# Patient Record
Sex: Male | Born: 1975 | Race: White | Hispanic: No | Marital: Married
Health system: Southern US, Community
[De-identification: ages and names within clinical notes are randomized; demographics above are authoritative.]

---

## 2014-01-25 ENCOUNTER — Ambulatory Visit (HOSPITAL_COMMUNITY)
Admission: RE | Admit: 2014-01-25 | Discharge: 2014-01-25 | Disposition: A | Discharge status: Home / Self Care | Payer: 59 | Source: Ambulatory Visit | Attending: Sports Medicine | Admitting: Sports Medicine

## 2014-01-25 DIAGNOSIS — M7710 Lateral epicondylitis, unspecified elbow: Secondary | ICD-10-CM | POA: Insufficient documentation

## 2014-01-25 DIAGNOSIS — M25539 Pain in unspecified wrist: Secondary | ICD-10-CM | POA: Insufficient documentation

## 2014-01-25 DIAGNOSIS — M25529 Pain in unspecified elbow: Secondary | ICD-10-CM | POA: Insufficient documentation

## 2014-01-25 DIAGNOSIS — IMO0001 Reserved for inherently not codable concepts without codable children: Secondary | ICD-10-CM | POA: Insufficient documentation

## 2014-01-25 DIAGNOSIS — M6281 Muscle weakness (generalized): Secondary | ICD-10-CM | POA: Insufficient documentation

## 2014-01-25 NOTE — Evaluation (Signed)
Physical Therapy Evaluation  Patient Details  Name: Edward Barnes. MRN: [id number] Date of Birth: [birth year]  Today's Date: 01/25/2014 Time: 0850-0935 PT Time Calculation (min): 45 min              Visit#: 1 of 12  Re-eval: 02/24/14    Authorization: Christella Scheuermann    Authorization Time Period:    Authorization Visit#: 1 of 8   Past Medical History: No past medical history on file. Past Surgical History: No past surgical history on file.  Subjective Symptoms/Limitations Symptoms: Lt achilles sore to the touch, no numbness and tingling, no swelling Pertinent History: Ankle began to hurt gradually no specific cause of injury.  Limitations: Walking How long can you walk comfortably?: 15 minutes Special Tests: FOTO score 57/100 Patient Stated Goals: Decrease pain and get back to walking regularly. To lose weight.  Pain Assessment Currently in Pain?: Yes Pain Score: 8  Pain Location: Ankle Pain Orientation: Left Pain Type: Acute pain Pain Onset: More than a month ago Pain Frequency: Intermittent Effect of Pain on Daily Activities: walkin and exercise, prolonged standing  Precautions/Restrictions  Precautions Precautions: None  Balance Screening Balance Screen Has the patient fallen in the past 6 months: No  Prior Functioning     Cognition/Observation Observation/Other Assessments Observations: FOTO: 54% limited  Sensation/Coordination/Flexibility/Functional Tests Flexibility Thomas: Positive 90/90: Positive Functional Tests Functional Tests: 3D hip excursions: limited hip extension Functional Tests: Gait: no function calcaneal inversion at or prior to heel off, excessive knee valgus, Lt hip internally rotates less than Rt, early heel rise  Functional Tests: Foot mechanics: Patient displays decreased function inversion with little midfoot locking mechanism with forced ankle inversion and excessive pronation of foot, as well as lumited talocrural mobility with limited  talar posterior gliding.   Assessment RLE AROM (degrees) Right Ankle Dorsiflexion: 12 RLE Strength Right Hip External Rotation : 45 Right Hip Internal Rotation : 18 LLE AROM (degrees) Left Hip External Rotation : 45 Left Hip Internal Rotation : 31 Left Knee Flexion: 80 Left Ankle Dorsiflexion: 5 Left Ankle Plantar Flexion: 60 Left Ankle Inversion: 40 Left Ankle Eversion: 10 LLE Strength Left Hip ABduction: 4/5 Left Knee Extension: 4/5 Left Ankle Dorsiflexion: 5/5 Palpation Palpation: moderate fascial restriction in gastroc and achilles   Exercise/Treatments Stretches Gastroc Stretch: Limitations Gastroc Stretch Limitations: 3way at wall 10x 3 sec  Manual Therapy Manual Therapy: Myofascial release Joint Mobilization: posterior talar glides to increase dorsiflexion, calcaneal inversion glides Myofascial Release: gastroc, soleus, achilles  Physical Therapy Assessment and Plan PT Assessment and Plan Clinical Impression Statement: Patient displays posterior achilles pain secondary to limited gastroc/soleus muscle mobility and weakness resulting in difficulty walking. Patient displays other contributing factors including: limited hip mobility, poor foot mechanics, limited great toe extension, and hip weakness. Patient will benefit from skilled PT to adress the above limitations and progress towards meeting goals Rehab Potential: Excellent Clinical Impairments Affecting Rehab Potential: Highly motivated.  PT Frequency: Min 2X/week PT Duration: 6 weeks PT Treatment/Interventions: Gait training;Stair training;Therapeutic exercise;Balance training;Patient/family education;Manual techniques PT Plan: Introduce hip flexion, hamstring, piriformis, and groin stretches next session (all 3 way) and standing 3D ankle Excursion. Execpting patient to required physical therapy for 6 weeks 2x weekly to meet all goals and return to work without pain.     Goals Home Exercise Program Pt/caregiver  will Perform Home Exercise Program: For increased ROM;For increased strengthening PT Goal: Perform Home Exercise Program - Progress: Progressing toward goal PT Short Term Goals Time to Complete Short Term  Goals: 3 weeks PT Short Term Goal 1: Patient will be able to walk for 1 hour to perform regular work required activities with pain < 2/10 PT Short Term Goal 2: Patient will demonstrate 4/5 gastroc strength to indicate improving ankle stability and ability to stand on toes to reach to top shelf of kictchen cabinet PT Short Term Goal 3: Patient's dorsiflexion ROM to improve to 20 degrees to allow for increased shock absorption during gait and increase stride length PT Short Term Goal 4: Increase great toe dorsiflexion to 60decrease to decrease early toe off during gait.  PT Long Term Goals Time to Complete Long Term Goals:  (6 weeks) PT Long Term Goal 1: Patient will be able to walk for 4 hours to perform regular work required activities and perform walking program for weight management program with no pain PT Long Term Goal 2: Patient will demonstrate 5/5 gastroc strength to indicate improvied ankle stability and perform extended time on toes for working arouind his house.  Long Term Goal 3: Patient's hip abduction and extension strength to improve to 5/5 to improve strength for squatting to the floor with feet flat.   Problem List Patient Active Problem List   Diagnosis Date Noted  . Pain in joint, forearm 01/25/2014  . Muscle weakness (generalized) 01/25/2014  . Lateral epicondylitis (tennis elbow) 01/25/2014    PT - End of Session Activity Tolerance: Patient tolerated treatment well General Behavior During Therapy: WFL for tasks assessed/performed PT Plan of Care PT Home Exercise Plan: 3 way calf stretch,   GP    Edward Barnes Edward Barnes 01/25/2014, 3:07 PM  Physician Documentation Your signature is required to indicate approval of the treatment plan as stated above.  Please sign and  either send electronically or make a copy of this report for your files and return this physician signed original.   Please mark one 1.__approve of plan  2. ___approve of plan with the following conditions.   ______________________________                                                          _____________________ Physician Signature                                                                                                             Date

## 2014-01-25 NOTE — Evaluation (Signed)
Occupational Therapy Evaluation  Patient Details  Name: Edward Barnes. MRN: [id number] Date of Birth: 1975-12-31  Today's Date: 01/25/2014 Time: 7062-3762 OT Time Calculation (min): 30 min OT eval [phone] 30'  Visit#: 1 of 8  Re-eval: 02/22/14  Assessment Diagnosis: Right Lateral Epocondylitis Next MD Visit: 02/16/14 Dr. Drema Dallas Prior Therapy: None  Authorization:    Authorization Time Period:    Authorization Visit#:   of     Past Medical History: No past medical history on file. Past Surgical History: No past surgical history on file.  Subjective Symptoms/Limitations Symptoms: S: I have a hard time lifting anything heavy and raising it up overhead. Pertinent History: Mr. Wat[place] began to experience pain on the lateral aspect of his right elbow in Feb. 2014 after he was finished scraping the ice from his car. Mr. Dewan had received a cortizone short in his right elbow last week. Dr. Drema Dallas has referred patient to occupational therapy for eval and treat.  Limitations: opening doors (turning knob, opening tight jars and bottles, lifting heavy items. Special Tests: FOTO score 57/100 Patient Stated Goals: To be pain free and be able to use arm normally.  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 Dominant Hand: Right Vision - History Baseline Vision: No visual deficits  Cognition/Observation Cognition Overall Cognitive Status: Within Functional Limits for tasks assessed Arousal/Alertness: Awake/alert Orientation Level: Oriented X4  Sensation/Coordination/Edema Coordination 9 Hole Peg Test: Rt 19.5" Lt:  18.1"  Edema Edema: 33 cm Rt elbow, 30.5 cm Lt elbow  Additional Assessments RUE AROM (degrees) Right Elbow Flexion: 50 Right Elbow Extension: 0 Right Forearm Pronation: 90 Degrees Right Forearm Supination: 60 Degrees Right Wrist Extension: 70 Degrees Right Wrist Flexion: 60 Degrees Right Wrist Radial Deviation: 20 Degrees Right Wrist Ulnar Deviation: 32 Degrees RUE Strength Right Elbow Flexion:  (4-/5) Right Elbow Extension:  (4-/5) Right Forearm Pronation:  (4-/5) Right Forearm Supination:  (4-/5) Right Wrist Flexion:  (4+/5) Right Wrist Extension:  (4+/5) Right Wrist Radial Deviation:  (4+/5) Grip (lbs): 60 Lateral Pinch: 26 lbs 3 Point Pinch: 18 lbs LUE Strength Grip (lbs): 115 Lateral Pinch: 24 lbs 3 Point Pinch: 24 lbs Palpation Palpation: Mod fascial restrictions on lateral region of right elbow.  Right Hand Strength - Pinch (lbs) Lateral Pinch: 26 lbs 3 Point Pinch: 18 lbs Left Hand Strength - Pinch (lbs) Lateral Pinch: 24 lbs 3 Point Pinch: 24 lbs    Occupational Therapy Assessment and Plan OT Assessment and Plan Clinical Impression Statement: A: Patient is a 38 y/o male s/p right lateral epicondylitis resulting in increased pain, fascial restrictions and limited joint mobility causing difficulty completing BADL and leisure tasks. Pt will benefit from skilled therapeutic intervention in order to improve on the following deficits: Increased fascial restricitons;Impaired UE functional use;Pain;Decreased strength;Decreased range of motion;Increased edema Rehab  Potential: Excellent OT Frequency: Min  2X/week OT Duration: 4 weeks OT Treatment/Interventions: Self-care/ADL training;Modalities;Splinting;Therapeutic exercise;Therapeutic activities;DME and/or AE instruction;Manual therapy;Patient/family education OT Plan: P: Pt will benefit from skilled OT services to increase ROM, increase strength, decrease fascial restrictions, decrease pain, and improve RUE functional use.       Treatment Plan: Myofascial release (MFR) and manual stretching, AROM, strengthening,modalities as needed for pain,     Goals Short Term Goals Time to Complete Short Term Goals: 2 weeks Short Term Goal 1: Patient will be educated on HEP. Short Term Goal 2: Patient report a pain score of 6/10 with daily tasks. Short Term Goal 3: Patient will increase AROM to WNL to increase ability to complete daily tasks with less difficulty.  Short Term Goal 4: Patient will increase Right elbow and wrist strength to 4+/5 to increase ability to lift heavy items overhead. Short Term Goal 5: Patient will increase grip strength by 10# and pinch strength by 2# to increase ability to lift and hold onto a heavy item such as a coffee. Additional Short Term Goals?: Yes Short Term Goal 6: Patient will decrease fascial restrictions from Mod to Min-Mod. Short Term Goal 7: Patient will decrease edema in right elbow region by being educated and participating in edema management techniques.  Long Term Goals Time to Complete Long Term Goals: 4 weeks Long Term Goal 1: Patient will return to highest level of independence with all BADL, work, and leisure tasks.  Long Term Goal 2: Patient will report a pain score of 4/10 or less with daily tasks.  Long Term Goal 3: Patient will increase Right elbow and wrist strength to 5/5 to increase ability to lift heavy items overhead.  Long Term Goal 4: Patient will increase grip strength by 20# and pinch strength by 4# to increase ability to lift and hold onto a heavy  items. Long Term Goal 5: Patient will decrease fascial restrictions from min-mod to trace.  Problem List Patient Active Problem List   Diagnosis Date Noted  . Pain in joint, forearm 01/25/2014  . Muscle weakness (generalized) 01/25/2014  . Lateral epicondylitis (tennis elbow) 01/25/2014    End of Session Activity Tolerance: Patient tolerated treatment well General Behavior During Therapy: [facility] for tasks assessed/performed OT Plan of Care OT Home Exercise Plan: elbow exercises  OT Patient Instructions: handout (scanned) Consulted and Agree with Plan of Care: Patient   Ailene Ravel, OTR/L,CBIS   01/25/2014, 10:13 AM  Physician Documentation Your signature is required to indicate approval of the treatment plan as stated above.  Please sign and either send electronically or make a copy of this report for your files and return this physician signed original.  Please mark one 1.__approve of plan  2. ___approve of plan with the following conditions.   ______________________________                                                          _____________________ Physician Signature  Date  

## 2014-01-27 ENCOUNTER — Ambulatory Visit (HOSPITAL_COMMUNITY)
Admission: RE | Admit: 2014-01-27 | Discharge: 2014-01-27 | Disposition: A | Discharge status: Home / Self Care | Payer: 59 | Source: Ambulatory Visit | Attending: Internal Medicine | Admitting: Internal Medicine

## 2014-01-27 ENCOUNTER — Ambulatory Visit (HOSPITAL_COMMUNITY)
Admission: RE | Admit: 2014-01-27 | Discharge: 2014-01-27 | Disposition: A | Discharge status: Home / Self Care | Payer: 59 | Source: Ambulatory Visit | Attending: Physical Therapy | Admitting: Physical Therapy

## 2014-01-27 DIAGNOSIS — M7710 Lateral epicondylitis, unspecified elbow: Secondary | ICD-10-CM | POA: Insufficient documentation

## 2014-01-27 DIAGNOSIS — M6281 Muscle weakness (generalized): Secondary | ICD-10-CM | POA: Insufficient documentation

## 2014-01-27 DIAGNOSIS — IMO0001 Reserved for inherently not codable concepts without codable children: Secondary | ICD-10-CM | POA: Insufficient documentation

## 2014-01-27 DIAGNOSIS — M25529 Pain in unspecified elbow: Secondary | ICD-10-CM | POA: Insufficient documentation

## 2014-01-27 NOTE — Progress Notes (Signed)
Physical Therapy Treatment Patient Details  Name: Edward Barnes. MRN: [id number] Date of Birth: 1976/02/02  Today's Date: 01/27/2014 Time: 1640-1720 PT Time Calculation (min): 40 min  Charges: Manual therapy 1640-1650, TherEx 1650-1720 Visit#: 2 of 12  Re-eval: 02/24/14 Assessment Diagnosis: Difficulty walking, ankle pain, and weakness.  Prior Therapy: no  Authorization: [employer]  Authorization Time Period:    Authorization Visit#: 2 of 8   Subjective: Symptoms/Limitations Symptoms: Lt achilles sore to the touch, notes increased pain this sessionattributed to massage last session.  Pertinent History: Ankle began to hurt gradually no specific cause of injury.  Limitations: Walking Pain Assessment Currently in Pain?: Yes Pain Score: 5  Pain Location: Ankle Pain Orientation: Left Pain Type: Acute pain Pain Onset: More than a month ago  Exercise/Treatments Stretches Active Hamstring Stretch: Limitations Active Hamstring Stretch Limitations: 3way 10x to 8" box Hip Flexor Stretch: Limitations Hip Flexor Stretch Limitations: 10x 3 way to 8" box Gastroc Stretch: Limitations Gastroc Stretch Limitations: 3way at wall 10x 3 sec Standing Rocker Board: 5 minutes (anterior posterior) Other Standing Knee Exercises: Groin stretch 2 way to 8" 10x 3sec Other Standing Knee Exercises: 3D ankle excursions 10x   Manual Therapy Manual Therapy: Myofascial release Joint Mobilization: posterior talar glides to increase dorsiflexion, calcaneal inversion glides Myofascial Release: MFR to medial elbow and extensor forarm to decrease fascial restrictions and pain.  Physical Therapy Assessment and Plan PT Assessment and Plan Clinical Impression Statement: Patient displays posterior achilles pain secondary to limited gastroc/soleus muscle mobility and weakness resulting in difficulty walking. Patient displays other contributing factors including: limited hip mobility, poor foot mechanics, limited  great toe extension, and hip weakness. Patient will benefit from skilled PT to adress the above limitations and progress towards meeting goals. Patient responded today's treatmens focusin on ankle and hip mobility with improved gait and decreased pain.  Patient was also instructed in use of a foam roller to perform self massage at home.  Pt will benefit from skilled therapeutic intervention in order to improve on the following deficits: Increased fascial restricitons;Impaired UE functional use;Pain;Decreased strength;Decreased range of motion;Increased edema Clinical Impairments Affecting Rehab Potential: Highly motivated.  PT Frequency: Min 2X/week PT Duration: 6 weeks PT Treatment/Interventions: Gait training;Stair training;Therapeutic exercise;Balance training;Patient/family education;Manual techniques PT Plan: Continue hip flexion, hamstring and groin stretches next session (all 3 way). Progress standing 3D ankle Excursion to at wall and introduce piriformis stretch. Introduce SFT squat with no weight and through pain free ROM next session to begin introducing low level strength.     Goals PT Short Term Goals PT Short Term Goal 1: Patient will be able to walk for 1 hour to perform regular work required activities with pain < 2/10 PT Short Term Goal 1 - Progress: Progressing toward goal PT Short Term Goal 2: Patient will demonstrate 4/5 gastroc strength to indicate improving ankle stability and ability to stand on toes to reach to top shelf of kictchen cabinet PT Short Term Goal 2 - Progress: Progressing toward goal PT Short Term Goal 3: Patient's dorsiflexion ROM to improve to 20 degrees to allow for increased shock absorption during gait and increase stride length PT Short Term Goal 3 - Progress: Progressing toward goal PT Short Term Goal 4: Increase great toe dorsiflexion to 60decrease to decrease early toe off during gait.  PT Short Term Goal 4 - Progress: Progressing toward goal PT Long  Term Goals PT Long Term Goal 1: Patient will be able to walk for 4 hours to perform  regular work required activities and perform walking program for [employer] management program with no pain PT Long Term Goal 1 - Progress: Progressing toward goal PT Long Term Goal 2: Patient will demonstrate 5/5 gastroc strength to indicate improvied ankle stability and perform extended time on toes for working arouind his house.  PT Long Term Goal 2 - Progress: Progressing toward goal Long Term Goal 3: Patient's hip abduction and extension strength to improve to 5/5 to improve strength for squatting to the floor with feet flat.  Long Term Goal 3 Progress: Progressing toward goal  Problem List Patient Active Problem List   Diagnosis Date Noted  . Pain in joint, forearm 01/25/2014  . Muscle weakness (generalized) 01/25/2014  . Lateral epicondylitis (tennis elbow) 01/25/2014    PT - End of Session Activity Tolerance: Patient tolerated treatment well General Behavior During Therapy: WFL for tasks assessed/performed PT Plan of Care PT Home Exercise Plan: 3 way calf stretch,  hip flexion, hamstring and groin stretches, 3D ankle excursions twice daily.     Camaryn Lumbert R Kamill Fulbright PT DPT 01/27/2014, 5:24 PM

## 2014-01-27 NOTE — Progress Notes (Signed)
Occupational Therapy Treatment Patient Details  Name: Edward Barnes. MRN: [id number] Date of Birth: 1976/02/16  Today's Date: 01/27/2014 Time: 6440-3474 OT Time Calculation (min): 47 min Manual 1427-1455 (28') Therapeutic Exercises 1455-1514 (19')  Visit#: 2 of 8  Re-eval: 02/22/14    Authorization:    Authorization Time Period:    Authorization Visit#:   of    Subjective Symptoms/Limitations Symptoms: "Its not been a bad day." Pain Assessment Currently in Pain?: Yes Pain Score: 2   Exercise/Treatments Elbow Exercises Elbow Flexion: PROM;AROM;10 reps Elbow Extension: PROM;AROM;10 reps Forearm Supination: PROM;AROM;10 reps Forearm Pronation: PROM;AROM;10 reps   Theraputty - Roll: red putty - focus on extending and flexing elbow Theraputty - Grip: red putty - in supinationa and pronation  Wrist Exercises Forearm Supination: PROM;AROM;10 reps Forearm Pronation: PROM;AROM;10 reps   Theraputty - Roll: red putty - focus on extending and flexing elbow Theraputty - Grip: red putty - in supinationa and pronation Theraputty - Pinch: red putty - 3-point and key pinches for supination and pronation  Wrist Weighted Stretch Wrist Flexion - Weighted Stretch: 1 pound;60 seconds Wrist Extension - Weighted Stretch: 1 pound;60 seconds  Fine Motor Coordination Wrist Flexion - Weighted Stretch: 1 pound;60 seconds Wrist Extension - Weighted Stretch: 1 pound;60 seconds     Manual Therapy Manual Therapy: Myofascial release Myofascial Release: MFR to medial elbow and extensor forarm to decrease fascial restrictions and pain.  Occupational Therapy Assessment and Plan OT Assessment and Plan Clinical Impression Statement: Initiated myofascial release this session - pt had good response with decreased restrictions in elbow region.  pt verbalized feeling good stretches with PROM (particulary elbow flexion in supination), with 1# weight, and pink theraputty. OT Plan: Continue MFR to  fascial restrictions. Educate pt on edema management.   Goals Short Term Goals Short Term Goal 1: Patient will be educated on HEP. Short Term Goal 1 Progress: Progressing toward goal Short Term Goal 2: Patient report a pain score of 6/10 with daily tasks. Short Term Goal 2 Progress: Progressing toward goal Short Term Goal 3: Patient will increase AROM to WNL to increase ability to complete daily tasks with less difficulty.  Short Term Goal 3 Progress: Progressing toward goal Short Term Goal 4: Patient will increase Right elbow and wrist strength to 4+/5 to increase ability to lift heavy items overhead. Short Term Goal 4 Progress: Progressing toward goal Short Term Goal 5: Patient will increase grip strength by 10# and pinch strength by 2# to increase ability to lift and hold onto a heavy item such as a coffee. Short Term Goal 5 Progress: Progressing toward goal Additional Short Term Goals?: Yes Short Term Goal 6: Patient will decrease fascial restrictions from Mod to Min-Mod. Short Term Goal 6 Progress: Progressing toward goal Short Term Goal 7: Patient will decrease edema in right elbow region by being educated and participating in edema management techniques.  Long Term Goals Long Term Goal 1: Patient will return to highest level of independence with all BADL, work, and leisure tasks.  Long Term Goal 1 Progress: Progressing toward goal Long Term Goal 2: Patient will report a pain score of 4/10 or less with daily tasks.  Long Term Goal 2 Progress: Progressing toward goal Long Term Goal 3: Patient will increase Right elbow and wrist strength to 5/5 to increase ability to lift heavy items overhead.  Long Term Goal 3 Progress: Progressing toward goal Long Term Goal 4: Patient will increase grip strength by 20# and pinch strength by 4# to  increase ability to lift and hold onto a heavy items. Long Term Goal 4 Progress: Progressing toward goal Long Term Goal 5: Patient will decrease fascial  restrictions from min-mod to trace. Long Term Goal 5 Progress: Progressing toward goal  Problem List Patient Active Problem List   Diagnosis Date Noted  . Pain in joint, forearm 01/25/2014  . Muscle weakness (generalized) 01/25/2014  . Lateral epicondylitis (tennis elbow) 01/25/2014    End of Session Activity Tolerance: Patient tolerated treatment well General Behavior During Therapy: [facility] for tasks assessed/performed  GO   Bea Graff, [place], OTR/L [phone]  01/27/2014, 4:23 PM

## 2014-01-31 ENCOUNTER — Ambulatory Visit (HOSPITAL_COMMUNITY)
Admission: RE | Admit: 2014-01-31 | Discharge: 2014-01-31 | Disposition: A | Discharge status: Home / Self Care | Payer: 59 | Source: Ambulatory Visit | Attending: Internal Medicine | Admitting: Internal Medicine

## 2014-01-31 NOTE — Progress Notes (Addendum)
Physical Therapy Treatment Patient Details  Name: Edward Barnes. MRN: [id number] Date of Birth: 1976/09/12  Today's Date: 01/31/2014 Time: 9678-9381 PT Time Calculation (min): 42 min Charge: TE 0175-1025, Manual 8527-7824  Visit#: 3 of 12  Re-eval: 02/24/14    Authorization: Christella Scheuermann  Authorization Time Period:    Authorization Visit#: 3 of 8   Subjective: Symptoms/Limitations Symptoms: Pain free today, compliant with HEP without questions Pain Assessment Currently in Pain?: No/denies  Precautions/Restrictions  Precautions Precautions: None  Exercise/Treatments Stretches Active Hamstring Stretch: Limitations Active Hamstring Stretch Limitations: 3way 10x to 8" box Hip Flexor Stretch: Limitations Hip Flexor Stretch Limitations: 10x 3 way to 8" box Piriformis Stretch: 3 reps;30 seconds;Limitations Piriformis Stretch Limitations: supine 4way cross supine with towel Gastroc Stretch: Limitations;3 reps;30 seconds Gastroc Stretch Limitations: slant board 3 way Soleus Stretch: 2 reps;30 seconds;Limitations Soleus Stretch Limitations: slant board Standing Heel Raises: 10 reps Rocker Board: 2 minutes;Limitations Rocker Board Limitations: R/L and A/P intermittent HHA Other Standing Knee Exercises: Groin stretch 2 way to 8" 10x 3sec Other Standing Knee Exercises: 3D ankle excursions 10x  SFT squat matrix 10x    Manual Therapy Joint mobs: posterior talar glides to increase dorsiflexion, calcaneal inversion glides   Physical Therapy Assessment and Plan PT Assessment and Plan Clinical Impression Statement: Began SFT squat matrix for LE strengthening and to increase dorsiflexion, pt required multimodal cueing for proper positioning to improve form to reduce stress Bil knees.  Continues hip and ankle excursion exercises to improve mobility with gait.  Added piriformis stretches for improved flexibilty.  Ended session with manual to improve joint mobility for increased  dorsiflexion.. PT Plan: Continue LE stretches to improve hip flexion, hamstrings, groin and piriformis stretches to improve flexibilty (3 way).  Continue hip and ankle excursion, SFT squat matrix wtih no pain and through pain free ROM.    Goals PT Short Term Goals PT Short Term Goal 1: Patient will be able to walk for 1 hour to perform regular work required activities with pain < 2/10 PT Short Term Goal 2: Patient will demonstrate 4/5 gastroc strength to indicate improving ankle stability and ability to stand on toes to reach to top shelf of kictchen cabinet PT Short Term Goal 2 - Progress: Progressing toward goal PT Short Term Goal 3: Patient's dorsiflexion ROM to improve to 20 degrees to allow for increased shock absorption during gait and increase stride length PT Short Term Goal 3 - Progress: Progressing toward goal PT Short Term Goal 4: Increase great toe dorsiflexion to 60decrease to decrease early toe off during gait.  PT Long Term Goals PT Long Term Goal 1: Patient will be able to walk for 4 hours to perform regular work required activities and perform walking program for weight management program with no pain PT Long Term Goal 2: Patient will demonstrate 5/5 gastroc strength to indicate improvied ankle stability and perform extended time on toes for working arouind his house.  Long Term Goal 3: Patient's hip abduction and extension strength to improve to 5/5 to improve strength for squatting to the floor with feet flat.   Problem List Patient Active Problem List   Diagnosis Date Noted  . Pain in joint, forearm 01/25/2014  . Muscle weakness (generalized) 01/25/2014  . Lateral epicondylitis (tennis elbow) 01/25/2014    PT - End of Session Activity Tolerance: Patient tolerated treatment well General Behavior During Therapy: [facility] for tasks assessed/performed  GP    Aldona Lento 01/31/2014, 5:06 PM

## 2014-01-31 NOTE — Progress Notes (Signed)
Occupational Therapy Treatment Patient Details  Name: Edward Barnes. MRN: [id number] Date of Birth: [birth year]  Today's Date: 01/31/2014 Time: 8546-2703 OT Time Calculation (min): 33 min MFR 5009-3818 10' Therex 2993-7169 23'  Visit#: 3 of 8  Re-eval: 02/22/14    Authorization:    Authorization Time Period:    Authorization Visit#:   of    Subjective Symptoms/Limitations Symptoms: S: It just depends on what I'm doing and that'll make it hurt.  Pain Assessment Currently in Pain?: No/denies  Precautions/Restrictions  Precautions Precautions: None  Exercise/Treatments Elbow Exercises Elbow Flexion: PROM;10 reps;Strengthening;15 reps (1#) Elbow Extension: PROM;10 reps;Strengthening (1#) Forearm Supination: PROM;10 reps;Strengthening;Theraband;15 reps (1#) Theraband Level (Supination): Level 2 (Red) Forearm Pronation: PROM;10 reps;Strengthening;Theraband;15 reps (1#) Theraband Level (Pronation): Level 2 (Red) Wrist Flexion: Theraband;15 reps Theraband Level (Wrist Flexion): Level 2 (Red) Wrist Extension: Theraband;15 reps Theraband Level (Wrist Extension): Level 2 (Red)   Wrist Exercises Forearm Supination: PROM;10 reps;Strengthening;Theraband;15 reps (1#) Theraband Level (Supination): Level 2 (Red) Forearm Pronation: PROM;10 reps;Strengthening;Theraband;15 reps (1#) Theraband Level (Pronation): Level 2 (Red) Wrist Flexion: Theraband;15 reps Theraband Level (Wrist Flexion): Level 2 (Red) Wrist Extension: Theraband;15 reps Theraband Level (Wrist Extension): Level 2 (Red)   Theraputty - Flatten: red- standing (after used pvc pipe to press holes Theraputty - Roll: red putty - focus on extending and flexing elbow Theraputty - Grip: red putty - in supinationa and pronation Theraputty - Pinch: red putty - 3-point and key pinches for supination and pronation  Wrist Weighted Stretch Supination - Weighted Stretch: 1 pound;60 seconds Wrist Flexion - Weighted Stretch: 1  pound;60 seconds Wrist Extension - Weighted Stretch: 1 pound;60 seconds      Manual Therapy Manual Therapy: Myofascial release Myofascial Release: MFR to medial elbow and extensor forarm to decrease fascial restrictions and pain.  Occupational Therapy Assessment and Plan OT Assessment and Plan Clinical Impression Statement: A: Discussed edema management. Edema appears to have gone down some since evaluation. Added strengthening exercises with1# weight and red theraband this date. Patient reports no pain or discomfort with exercises.  OT Plan: P: Added weighted wrist flexion/extension bar.   Goals Short Term Goals Time to Complete Short Term Goals: 2 weeks Short Term Goal 1: Patient will be educated on HEP. Short Term Goal 1 Progress: Progressing toward goal Short Term Goal 2: Patient report a pain score of 6/10 with daily tasks. Short Term Goal 2 Progress: Progressing toward goal Short Term Goal 3: Patient will increase AROM to WNL to increase ability to complete daily tasks with less difficulty.  Short Term Goal 3 Progress: Progressing toward goal Short Term Goal 4: Patient will increase Right elbow and wrist strength to 4+/5 to increase ability to lift heavy items overhead. Short Term Goal 4 Progress: Progressing toward goal Short Term Goal 5: Patient will increase grip strength by 10# and pinch strength by 2# to increase ability to lift and hold onto a heavy item such as a coffee. Short Term Goal 5 Progress: Progressing toward goal Additional Short Term Goals?: Yes Short Term Goal 6: Patient will decrease fascial restrictions from Mod to Min-Mod. Short Term Goal 6 Progress: Progressing toward goal Short Term Goal 7: Patient will decrease edema in right elbow region by being educated and participating in edema management techniques.  Short Term Goal 7 Progress: Progressing toward goal Long Term Goals Time to Complete Long Term Goals: 4 weeks Long Term Goal 1: Patient will return to  highest level of independence with all BADL, work, and leisure tasks.  Long Term Goal 1 Progress: Progressing toward goal Long Term Goal 2: Patient will report a pain score of 4/10 or less with daily tasks.  Long Term Goal 2 Progress: Progressing toward goal Long Term Goal 3: Patient will increase Right elbow and wrist strength to 5/5 to increase ability to lift heavy items overhead.  Long Term Goal 3 Progress: Progressing toward goal Long Term Goal 4: Patient will increase grip strength by 20# and pinch strength by 4# to increase ability to lift and hold onto a heavy items. Long Term Goal 4 Progress: Progressing toward goal Long Term Goal 5: Patient will decrease fascial restrictions from min-mod to trace. Long Term Goal 5 Progress: Progressing toward goal  Problem List Patient Active Problem List   Diagnosis Date Noted  . Pain in joint, forearm 01/25/2014  . Muscle weakness (generalized) 01/25/2014  . Lateral epicondylitis (tennis elbow) 01/25/2014    End of Session Activity Tolerance: Patient tolerated treatment well General Behavior During Therapy: [facility] for tasks assessed/performed   Ailene Ravel, OTR/L,CBIS   01/31/2014, 2:25 PM

## 2014-02-02 ENCOUNTER — Ambulatory Visit (HOSPITAL_COMMUNITY)
Admission: RE | Admit: 2014-02-02 | Discharge: 2014-02-02 | Disposition: A | Discharge status: Home / Self Care | Payer: 59 | Source: Ambulatory Visit | Attending: Internal Medicine | Admitting: Internal Medicine

## 2014-02-02 NOTE — Progress Notes (Signed)
Occupational Therapy Treatment Patient Details  Name: Boaz Berisha. MRN: [id number] Date of Birth: 10-13-1975  Today's Date: 02/02/2014 Time: 1829-9371 OT Time Calculation (min): 38 min MFR 696-789 10' Therex [id number] 28'   Visit#: 4 of 8  Re-eval: 02/22/14    Authorization:    Authorization Time Period:    Authorization Visit#:   of    Subjective Pain Assessment Currently in Pain?: No/denies  Precautions/Restrictions  Precautions Precautions: None  Exercise/Treatments Elbow Exercises Elbow Flexion: PROM;10 reps;Strengthening;20 reps (1#) Elbow Extension: PROM;10 reps;Strengthening;20 reps (1#) Forearm Supination: PROM;10 reps;Strengthening;Theraband;20 reps (1#) Theraband Level (Supination): Level 2 (Red) Forearm Pronation: PROM;10 reps;Strengthening;Theraband;20 reps (1#) Theraband Level (Pronation): Level 2 (Red) Wrist Flexion: PROM;10 reps;Strengthening;Theraband;20 reps (1#) Theraband Level (Wrist Flexion): Level 2 (Red) Wrist Extension: PROM;10 reps;Strengthening;Theraband;20 reps (1#) Theraband Level (Wrist Extension): Level 2 (Red) Other elbow exercises: Weighted wrist flexion/extension; 5X up/down with elbows bent 90 degrees. 3#    Wrist Exercises Forearm Supination: PROM;10 reps;Strengthening;Theraband;20 reps (1#) Theraband Level (Supination): Level 2 (Red) Forearm Pronation: PROM;10 reps;Strengthening;Theraband;20 reps (1#) Theraband Level (Pronation): Level 2 (Red) Wrist Flexion: PROM;10 reps;Strengthening;Theraband;20 reps (1#) Theraband Level (Wrist Flexion): Level 2 (Red) Wrist Extension: PROM;10 reps;Strengthening;Theraband;20 reps (1#) Theraband Level (Wrist Extension): Level 2 (Red)   Theraputty - Flatten: red- standing (afterwards used pvc pipe to press holes into putty) Theraputty - Roll: red putty - focus on extending and flexing elbow Theraputty - Grip: red putty - in supinationa and pronation Theraputty - Pinch: red putty - 3-point and  key pinches for supination and pronation Hand Gripper with Medium Beads: 10 beads with black handgripper  Wrist Weighted Stretch Supination - Weighted Stretch: 1 pound;60 seconds Wrist Flexion - Weighted Stretch: 1 pound;60 seconds Wrist Extension - Weighted Stretch: 1 pound;60 seconds      Manual Therapy Manual Therapy: Myofascial release Myofascial Release: MFR to medial elbow and extensor forarm to decrease fascial restrictions and pain.  Occupational Therapy Assessment and Plan OT Assessment and Plan Clinical Impression Statement: A: Added weighted wrist flexion/extension bar. patient tolerated well.  OT Plan: P: Use 2# handweight. Green theraputty   Goals Short Term Goals Time to Complete Short Term Goals: 2 weeks Short Term Goal 1: Patient will be educated on HEP. Short Term Goal 2: Patient report a pain score of 6/10 with daily tasks. Short Term Goal 3: Patient will increase AROM to WNL to increase ability to complete daily tasks with less difficulty.  Short Term Goal 4: Patient will increase Right elbow and wrist strength to 4+/5 to increase ability to lift heavy items overhead. Short Term Goal 5: Patient will increase grip strength by 10# and pinch strength by 2# to increase ability to lift and hold onto a heavy item such as a coffee. Additional Short Term Goals?: Yes Short Term Goal 6: Patient will decrease fascial restrictions from Mod to Min-Mod. Short Term Goal 7: Patient will decrease edema in right elbow region by being educated and participating in edema management techniques.  Long Term Goals Time to Complete Long Term Goals: 4 weeks Long Term Goal 1: Patient will return to highest level of independence with all BADL, work, and leisure tasks.  Long Term Goal 2: Patient will report a pain score of 4/10 or less with daily tasks.  Long Term Goal 3: Patient will increase Right elbow and wrist strength to 5/5 to increase ability to lift heavy items overhead.  Long Term  Goal 4: Patient will increase grip strength by 20# and pinch strength by  4# to increase ability to lift and hold onto a heavy items. Long Term Goal 5: Patient will decrease fascial restrictions from min-mod to trace.  Problem List Patient Active Problem List   Diagnosis Date Noted  . Pain in joint, forearm 01/25/2014  . Muscle weakness (generalized) 01/25/2014  . Lateral epicondylitis (tennis elbow) 01/25/2014    End of Session Activity Tolerance: Patient tolerated treatment well General Behavior During Therapy: [facility] for tasks assessed/performed   Ailene Ravel, OTR/L,CBIS   02/02/2014, 9:27 AM

## 2014-02-02 NOTE — Progress Notes (Signed)
Physical Therapy Treatment Patient Details  Name: Edward Barnes. MRN: [id number] Date of Birth: 1976-02-19  Today's Date: 02/02/2014 Time: 7829-5621 PT Time Calculation (min): 43 min Charge: TE 3086-5784, Manual 6962-9528  Visit#: 4 of 8  Re-eval: 02/24/14 Assessment Diagnosis: Difficulty walking, ankle pain, and weakness.  Next MD Visit: Gilles Chiquito 02/16/2014 Prior Therapy: no  Authorization: [employer]  Authorization Time Period:    Authorization Visit#: 4 of 8   Subjective: Symptoms/Limitations Symptoms: Pt stated he feels better everyday in ankle and overall hip mobility, pain free today. Pain Assessment Currently in Pain?: No/denies  Precautions/Restrictions  Precautions Precautions: None  Exercise/Treatments Stretches Active Hamstring Stretch: Limitations Active Hamstring Stretch Limitations: 3way 10x to 8" box Hip Flexor Stretch: Limitations Hip Flexor Stretch Limitations: 10x 3 way to 8" box Gastroc Stretch: Limitations;3 reps;30 seconds Gastroc Stretch Limitations: slant board 3 way Soleus Stretch: 2 reps;30 seconds;Limitations Soleus Stretch Limitations: slant board Standing Heel Raises: 10 reps;Limitations Heel Raises Limitations: toe raises Forward Lunges: Both;10 reps;Limitations Forward Lunges Limitations: on 8in box Side Lunges: Both;10 reps Lateral Step Up: 15 reps;Hand Hold: 0;Step Height: 4";Left Forward Step Up: Left;15 reps;Hand Hold: 0;Step Height: 4" Rocker Board: 2 minutes;Limitations Rocker Board Limitations: R/L and A/P intermittent HHA Other Standing Knee Exercises: 3D ankle and hip excursions 15x  SFT squat matrix 10x      Manual Therapy Manual Therapy: Myofascial release Joint Mobilization: posterior talar glides to increase dorsiflexion, calcaneal inversion glides, MFR to posterior heel region to reduce fascial restrictions   Physical Therapy Assessment and Plan PT Assessment and Plan Clinical Impression Statement: Pt progressed  well towards POC for LE strengthening and improving ROM.  Began stair training and lunges for functional strengthening and to improve dorsiflexion.  Pt required verbal cueing and demonstration for proper form with FST squats to improve form.  Manual techniques complete to improve joint mobilty and reduce fascial restrictinos  PT Plan: Continue LE stretches to improve hip flexion, hamstrings, groin and piriformis stretches to improve flexibilty (3 way).  Continue hip and ankle excursion, SFT squat matrix wtih no pain and through pain free ROM.    Goals PT Short Term Goals PT Short Term Goal 1: Patient will be able to walk for 1 hour to perform regular work required activities with pain < 2/10 PT Short Term Goal 1 - Progress: Progressing toward goal PT Short Term Goal 2: Patient will demonstrate 4/5 gastroc strength to indicate improving ankle stability and ability to stand on toes to reach to top shelf of kictchen cabinet PT Short Term Goal 2 - Progress: Progressing toward goal PT Short Term Goal 3: Patient's dorsiflexion ROM to improve to 20 degrees to allow for increased shock absorption during gait and increase stride length PT Short Term Goal 3 - Progress: Progressing toward goal PT Short Term Goal 4: Increase great toe dorsiflexion to 60decrease to decrease early toe off during gait.  PT Short Term Goal 4 - Progress: Progressing toward goal PT Long Term Goals PT Long Term Goal 1: Patient will be able to walk for 4 hours to perform regular work required activities and perform walking program for weight management program with no pain PT Long Term Goal 2: Patient will demonstrate 5/5 gastroc strength to indicate improvied ankle stability and perform extended time on toes for working arouind his house.  Long Term Goal 3: Patient's hip abduction and extension strength to improve to 5/5 to improve strength for squatting to the floor with feet flat.   Problem  List Patient Active Problem List    Diagnosis Date Noted  . Pain in joint, forearm 01/25/2014  . Muscle weakness (generalized) 01/25/2014  . Lateral epicondylitis (tennis elbow) 01/25/2014    PT - End of Session Activity Tolerance: Patient tolerated treatment well General Behavior During Therapy: [facility] for tasks assessed/performed  GP    Aldona Lento 02/02/2014, 10:08 AM

## 2014-02-07 ENCOUNTER — Ambulatory Visit (HOSPITAL_COMMUNITY)
Admission: RE | Admit: 2014-02-07 | Discharge: 2014-02-07 | Disposition: A | Discharge status: Home / Self Care | Payer: 59 | Source: Ambulatory Visit | Attending: Internal Medicine | Admitting: Internal Medicine

## 2014-02-07 NOTE — Progress Notes (Signed)
Physical Therapy Treatment Patient Details  Name: Edward Barnes. MRN: [id number] Date of Birth: 1976/01/31  Today's Date: 02/07/2014 Time: 5852-7782 PT Time Calculation (min): 24 min Charge TE [id number]  Visit#: 5 of 8  Re-eval: 02/24/14 Assessment Diagnosis: Difficulty walking, ankle pain, and weakness.  Next MD Visit: Gilles Chiquito 02/16/2014 Prior Therapy: no  Authorization: [employer]  Authorization Time Period:    Authorization Visit#: 5 of 8   Subjective: Symptoms/Limitations Symptoms: Minimal pain today, reports ankle improving daily pain scale 2/10 Pain Assessment Currently in Pain?: Yes Pain Score: 2  Pain Location: Ankle Pain Orientation: Left  Precautions/Restrictions  Precautions Precautions: None  Exercise/Treatments Stretches Active Hamstring Stretch: Limitations Active Hamstring Stretch Limitations: 3way 10x to 8" box Hip Flexor Stretch: Limitations Hip Flexor Stretch Limitations: 10x 3 way to 8" box Gastroc Stretch: Limitations;3 reps;30 seconds Gastroc Stretch Limitations: slant board 3 way Soleus Stretch: 2 reps;30 seconds;Limitations Soleus Stretch Limitations: slant board Standing Heel Raises: 10 reps;Limitations Heel Raises Limitations: toe raises Forward Lunges: Both;10 reps;Limitations Forward Lunges Limitations: on 8in box Side Lunges: Both;10 reps Lateral Step Up: 15 reps;Hand Hold: 0;Step Height: 4";Left Forward Step Up: Left;15 reps;Hand Hold: 0;Step Height: 4" Rocker Board: 2 minutes;Limitations Rocker Board Limitations: R/L and A/P intermittent HHA Other Standing Knee Exercises: 3D ankle and hip excursions 15x  SFT squat matrix 10x   Physical Therapy Assessment and Plan PT Assessment and Plan Clinical Impression Statement: Pt progressing well towards POC for LE strengthening, AROM and pain control.  Pt able to complete all exercises with min cueing for form with squats to reduce stress on knees and increase dorsiflexion.  From  [phone] pt completed therex with physical therapist tech wtih supervision by PTA.  Pt reports pain reduced at end of session.   PT Plan: Continue LE stretches to improve hip flexion, hamstrings, groin and piriformis stretches to improve flexibilty (3 way).  Continue hip and ankle excursion, SFT squat matrix wtih no pain and through pain free ROM.  Progress glut strength as tolerated.    Goals PT Short Term Goals PT Short Term Goal 1: Patient will be able to walk for 1 hour to perform regular work required activities with pain < 2/10 PT Short Term Goal 1 - Progress: Progressing toward goal PT Short Term Goal 2: Patient will demonstrate 4/5 gastroc strength to indicate improving ankle stability and ability to stand on toes to reach to top shelf of kictchen cabinet PT Short Term Goal 2 - Progress: Progressing toward goal PT Short Term Goal 3: Patient's dorsiflexion ROM to improve to 20 degrees to allow for increased shock absorption during gait and increase stride length PT Short Term Goal 3 - Progress: Progressing toward goal PT Short Term Goal 4: Increase great toe dorsiflexion to 60decrease to decrease early toe off during gait.  PT Long Term Goals PT Long Term Goal 1: Patient will be able to walk for 4 hours to perform regular work required activities and perform walking program for weight management program with no pain PT Long Term Goal 2: Patient will demonstrate 5/5 gastroc strength to indicate improvied ankle stability and perform extended time on toes for working arouind his house.  Long Term Goal 3: Patient's hip abduction and extension strength to improve to 5/5 to improve strength for squatting to the floor with feet flat.   Problem List Patient Active Problem List   Diagnosis Date Noted  . Pain in joint, forearm 01/25/2014  . Muscle weakness (generalized) 01/25/2014  . Lateral  epicondylitis (tennis elbow) 01/25/2014    PT - End of Session Activity Tolerance: Patient tolerated  treatment well General Behavior During Therapy: [facility] for tasks assessed/performed  GP    Aldona Lento 02/07/2014, 12:14 PM

## 2014-02-07 NOTE — Progress Notes (Signed)
Occupational Therapy Treatment Patient Details  Name: Edward Barnes. MRN: [id number] Date of Birth: December 30, 1975  Today's Date: 02/07/2014 Time: 3500-9381 OT Time Calculation (min): 42 min MFR 803-810 8' Therex 829-937 34'  Visit#: 5 of 8  Re-eval: 02/22/14    Authorization:    Authorization Time Period:    Authorization Visit#:   of    Subjective Symptoms/Limitations Symptoms: S: I have no pain today. It feels good.  Pain Assessment Currently in Pain?: No/denies  Precautions/Restrictions  Precautions Precautions: None  Exercise/Treatments ROM / Strengthening / Isometric Strengthening UBE (Upper Arm Bike): 2.0 5' forward Cybex Press: 3 plate;20 reps Cybex Row: 3 plate;20 reps   Elbow Exercises Elbow Flexion: PROM;10 reps;Strengthening;20 reps Elbow Extension: PROM;10 reps;Strengthening;20 reps Forearm Supination: PROM;10 reps;Strengthening;Theraband;20 reps Theraband Level (Supination): Level 2 (Red) Forearm Pronation: PROM;10 reps;Strengthening;Theraband;20 reps Theraband Level (Pronation): Level 2 (Red) Wrist Flexion: PROM;10 reps;Strengthening;Theraband;20 reps Theraband Level (Wrist Flexion): Level 2 (Red) Wrist Extension: PROM;10 reps;Strengthening;Theraband;20 reps Theraband Level (Wrist Extension): Level 2 (Red) Other elbow exercises: Weighted wrist flexion/extension; 5X up/down with elbows bent 90 degrees. 3#   Theraputty - Flatten: green-standing Theraputty - Roll: green - focus on extending and flexing elbow Theraputty - Grip: green - supination and pronation Hand Gripper with Medium Beads: 10 beads with black handgripper Hand Gripper with Small Beads: 10 beads with handgripper  Wrist Exercises Forearm Supination: PROM;10 reps;Strengthening;Theraband;20 reps Theraband Level (Supination): Level 2 (Red) Forearm Pronation: PROM;10 reps;Strengthening;Theraband;20 reps Theraband Level (Pronation): Level 2 (Red) Wrist Flexion: PROM;10  reps;Strengthening;Theraband;20 reps Theraband Level (Wrist Flexion): Level 2 (Red) Wrist Extension: PROM;10 reps;Strengthening;Theraband;20 reps Theraband Level (Wrist Extension): Level 2 (Red)   Wrist Weighted Stretch Supination - Weighted Stretch: 2 pounds;2 minutes Wrist Flexion - Weighted Stretch: 2 pounds;2 minutes Wrist Extension - Weighted Stretch: 2 pounds;2 minutes      Manual Therapy Manual Therapy: Myofascial release Myofascial Release: MFR to medial elbow and extensor forarm to decrease fascial restrictions and pain. D/C next session.  Occupational Therapy Assessment and Plan OT Assessment and Plan Clinical Impression Statement: A: Increased to green theraputty, added 2# handweight, Cybex press/row, and UBE bike. Pt tolerated well.  OT Plan: P: D/C MFR and manual stretching. Add table push-ups.   Goals Short Term Goals Time to Complete Short Term Goals: 2 weeks Short Term Goal 1: Patient will be educated on HEP. Short Term Goal 1 Progress: Progressing toward goal Short Term Goal 2: Patient report a pain score of 6/10 with daily tasks. Short Term Goal 2 Progress: Progressing toward goal Short Term Goal 3: Patient will increase AROM to WNL to increase ability to complete daily tasks with less difficulty.  Short Term Goal 3 Progress: Progressing toward goal Short Term Goal 4: Patient will increase Right elbow and wrist strength to 4+/5 to increase ability to lift heavy items overhead. Short Term Goal 4 Progress: Progressing toward goal Short Term Goal 5: Patient will increase grip strength by 10# and pinch strength by 2# to increase ability to lift and hold onto a heavy item such as a coffee. Short Term Goal 5 Progress: Progressing toward goal Additional Short Term Goals?: Yes Short Term Goal 6: Patient will decrease fascial restrictions from Mod to Min-Mod. Short Term Goal 6 Progress: Progressing toward goal Short Term Goal 7: Patient will decrease edema in right elbow  region by being educated and participating in edema management techniques.  Short Term Goal 7 Progress: Progressing toward goal Long Term Goals Time to Complete Long Term Goals: 4 weeks  Long Term Goal 1: Patient will return to highest level of independence with all BADL, work, and leisure tasks.  Long Term Goal 2: Patient will report a pain score of 4/10 or less with daily tasks.  Long Term Goal 3: Patient will increase Right elbow and wrist strength to 5/5 to increase ability to lift heavy items overhead.  Long Term Goal 4: Patient will increase grip strength by 20# and pinch strength by 4# to increase ability to lift and hold onto a heavy items. Long Term Goal 5: Patient will decrease fascial restrictions from min-mod to trace.  Problem List Patient Active Problem List   Diagnosis Date Noted  . Pain in joint, forearm 01/25/2014  . Muscle weakness (generalized) 01/25/2014  . Lateral epicondylitis (tennis elbow) 01/25/2014    End of Session Activity Tolerance: Patient tolerated treatment well General Behavior During Therapy: [facility] for tasks assessed/performed   Ailene Ravel, OTR/L,CBIS   02/07/2014, 8:44 AM

## 2014-02-09 ENCOUNTER — Ambulatory Visit (HOSPITAL_COMMUNITY)
Admission: RE | Admit: 2014-02-09 | Discharge: 2014-02-09 | Disposition: A | Discharge status: Home / Self Care | Payer: 59 | Source: Ambulatory Visit | Attending: Internal Medicine | Admitting: Internal Medicine

## 2014-02-09 NOTE — Progress Notes (Signed)
Occupational Therapy Treatment Patient Details  Name: Edward Barnes. MRN: [id number] Date of Birth: 09/08/1976  Today's Date: 02/09/2014 Time: 0160-1093 OT Time Calculation (min): 40 min Massage [phone] 9' Therex 235-573 23' Ice massage [phone] 8'  Visit#: 6 of 8  Re-eval: 02/22/14    Authorization:    Authorization Time Period:    Authorization Visit#:   of    Subjective Symptoms/Limitations Symptoms: S: Today is feels really tender. It's wierd. I don't recall doing anything to it yesterday.  Pain Assessment Currently in Pain?: Yes Pain Score: 7  Pain Location: Ankle Pain Orientation: Left Pain Type: Chronic pain  Precautions/Restrictions  Precautions Precautions: None  Exercise/Treatments Elbow Exercises Elbow Flexion: Strengthening;20 reps Elbow Extension: Strengthening;20 reps Forearm Supination: Strengthening;Theraband;20 reps Theraband Level (Supination): Level 2 (Red) Forearm Pronation: Strengthening;Theraband;20 reps Theraband Level (Pronation): Level 2 (Red) Wrist Flexion: Strengthening;Theraband;20 reps Theraband Level (Wrist Flexion): Level 2 (Red) Wrist Extension: Strengthening;Theraband;20 reps Theraband Level (Wrist Extension): Level 2 (Red) Other elbow exercises: Weighted wrist flexion/extension; 4X up/down;3#   Theraputty:  (Used pvc pipe to press holes into putty) Theraputty - Flatten: green-standing Theraputty - Roll: green - focus on extending and flexing elbow Theraputty - Grip: green - supination and pronation     Modalities Modalities: Cryotherapy Manual Therapy Manual Therapy: Massage Massage: Masssage to medial elbow and extensory forearm to decrease pain.  Cryotherapy Number Minutes Cryotherapy: 8 Minutes Cryotherapy Location: Other (comment) (elbow) Type of Cryotherapy: Ice massage  Occupational Therapy Assessment and Plan OT Assessment and Plan Clinical Impression Statement: A: Patient reported increase pain/tenderness  this session. Did not add table push-ups due to pain. Some pain noted with exercises. Massage completed at beginning of session and  Ice massage added at end of session for pain management.  OT Plan: P: Add table push-ups   Goals Short Term Goals Time to Complete Short Term Goals: 2 weeks Short Term Goal 1: Patient will be educated on HEP. Short Term Goal 1 Progress: Progressing toward goal Short Term Goal 2: Patient report a pain score of 6/10 with daily tasks. Short Term Goal 2 Progress: Progressing toward goal Short Term Goal 3: Patient will increase AROM to WNL to increase ability to complete daily tasks with less difficulty.  Short Term Goal 3 Progress: Progressing toward goal Short Term Goal 4: Patient will increase Right elbow and wrist strength to 4+/5 to increase ability to lift heavy items overhead. Short Term Goal 4 Progress: Progressing toward goal Short Term Goal 5: Patient will increase grip strength by 10# and pinch strength by 2# to increase ability to lift and hold onto a heavy item such as a coffee. Short Term Goal 5 Progress: Progressing toward goal Additional Short Term Goals?: Yes Short Term Goal 6: Patient will decrease fascial restrictions from Mod to Min-Mod. Short Term Goal 6 Progress: Progressing toward goal Short Term Goal 7: Patient will decrease edema in right elbow region by being educated and participating in edema management techniques.  Short Term Goal 7 Progress: Progressing toward goal Long Term Goals Time to Complete Long Term Goals: 4 weeks Long Term Goal 1: Patient will return to highest level of independence with all BADL, work, and leisure tasks.  Long Term Goal 1 Progress: Progressing toward goal Long Term Goal 2: Patient will report a pain score of 4/10 or less with daily tasks.  Long Term Goal 2 Progress: Progressing toward goal Long Term Goal 3: Patient will increase Right elbow and wrist strength to 5/5 to increase ability  to lift heavy items  overhead.  Long Term Goal 3 Progress: Progressing toward goal Long Term Goal 4: Patient will increase grip strength by 20# and pinch strength by 4# to increase ability to lift and hold onto a heavy items. Long Term Goal 4 Progress: Progressing toward goal Long Term Goal 5: Patient will decrease fascial restrictions from min-mod to trace. Long Term Goal 5 Progress: Progressing toward goal  Problem List Patient Active Problem List   Diagnosis Date Noted  . Pain in joint, forearm 01/25/2014  . Muscle weakness (generalized) 01/25/2014  . Lateral epicondylitis (tennis elbow) 01/25/2014    End of Session Activity Tolerance: Patient tolerated treatment well General Behavior During Therapy: [facility] for tasks assessed/performed   Ailene Ravel, OTR/L,CBIS   02/09/2014, 8:50 AM

## 2014-02-09 NOTE — Progress Notes (Signed)
Physical Therapy Treatment Patient Details  Name: Edward Barnes. MRN: [id number] Date of Birth: 10/18/1975  Today's Date: 02/09/2014 Time: 6433-2951 PT Time Calculation (min): 45 min   Charges: Manual [phone], TherEx [phone] Visit#: 6 of 8  Re-eval: 02/24/14 Assessment Diagnosis: Difficulty walking, ankle pain, and weakness.  Next MD Visit: Gilles Chiquito 02/16/2014 Prior Therapy: no  Authorization: [employer]  Authorization Visit#: 6 of 8   Subjective: Symptoms/Limitations Symptoms: Patient reports increased pain and swelling Pain Assessment Currently in Pain?: Yes Pain Score: 7  Pain Location: Ankle Pain Orientation: Left Pain Type: Chronic pain  Precautions/Restrictions  Precautions Precautions: None  Exercise/Treatments Stretches 3D ankle excursion 10x 3-way hamstring stretch 3 sec hold 10x 3-way calf stretch with wedge 3 sec hold 10x Standing Rocker Board: 2 minutes;Limitations Rocker Board Limitations: R/L and A/P intermittent HHA Other Standing Knee Exercises: 3 way flamengo 10x, no internal rotation  squat matrix 3x each Modalities Modalities: Cryotherapy Manual Therapy Manual Therapy: Massage Joint Mobilization: posterior talar glides to increase dorsiflexion, calcaneal inversion glides, MFR to gastroc region to reduce fascial restriction Massage: Masssage to medial elbow and extensory forearm to decrease pain.  Cryotherapy Number Minutes Cryotherapy: 8 Minutes Cryotherapy Location: Other (comment) (elbow) Type of Cryotherapy: Ice massage  Physical Therapy Assessment and Plan PT Assessment and Plan Clinical Impression Statement: Patient had increased pain this session in achilles, limiting therapy. Session focused on improving lateral gastroc mobility and medial gastroc stability as patient displays increased pain with Lateral gastroc activation and limited gastroc mobility. Patient noted decreased symptoms following session. Pt will benefit from skilled  therapeutic intervention in order to improve on the following deficits: Increased fascial restricitons;Impaired UE functional use;Pain;Decreased strength;Decreased range of motion;Increased edema Rehab Potential: Excellent Clinical Impairments Affecting Rehab Potential: Highly motivated.  PT Treatment/Interventions: Financial trader;Therapeutic exercise;Balance training;Patient/family education;Manual techniques PT Plan: Continue LE stretches with specific focus on calf flexibility. Continue hip and ankle excursion (perform at wall next session), SFT (27 positions) squat matrix through pain free ROM.  Progress glut strength as tolerated.    Goals PT Short Term Goals PT Short Term Goal 1: Patient will be able to walk for 1 hour to perform regular work required activities with pain < 2/10 PT Short Term Goal 1 - Progress: Progressing toward goal PT Short Term Goal 2: Patient will demonstrate 4/5 gastroc strength to indicate improving ankle stability and ability to stand on toes to reach to top shelf of kictchen cabinet PT Short Term Goal 2 - Progress: Progressing toward goal PT Short Term Goal 3: Patient's dorsiflexion ROM to improve to 20 degrees to allow for increased shock absorption during gait and increase stride length PT Short Term Goal 3 - Progress: Progressing toward goal PT Short Term Goal 4: Increase great toe dorsiflexion to 60decrease to decrease early toe off during gait.  PT Short Term Goal 4 - Progress: Progressing toward goal PT Long Term Goals PT Long Term Goal 1: Patient will be able to walk for 4 hours to perform regular work required activities and perform walking program for weight management program with no pain PT Long Term Goal 1 - Progress: Progressing toward goal PT Long Term Goal 2: Patient will demonstrate 5/5 gastroc strength to indicate improvied ankle stability and perform extended time on toes for working arouind his house.  PT Long Term Goal 2 -  Progress: Progressing toward goal Long Term Goal 3: Patient's hip abduction and extension strength to improve to 5/5 to improve strength for squatting to  the floor with feet flat.  Long Term Goal 3 Progress: Progressing toward goal  Problem List Patient Active Problem List   Diagnosis Date Noted  . Pain in joint, forearm 01/25/2014  . Muscle weakness (generalized) 01/25/2014  . Lateral epicondylitis (tennis elbow) 01/25/2014    PT - End of Session Activity Tolerance: Patient tolerated treatment well General Behavior During Therapy: [facility] for tasks assessed/performed  GP    Felicia Bloomquist R Jamani Eley PT DPT 02/09/2014, 12:08 PM

## 2014-02-14 ENCOUNTER — Ambulatory Visit (HOSPITAL_COMMUNITY)
Admission: RE | Admit: 2014-02-14 | Discharge: 2014-02-14 | Disposition: A | Discharge status: Home / Self Care | Payer: 59 | Source: Ambulatory Visit

## 2014-02-14 NOTE — Progress Notes (Signed)
Physical Therapy Progress note prior MD apt /Treatment note  Patient Details  Name: Edward Barnes. MRN: [id number] Date of Birth: [birth year]  Today's Date: 02/14/2014 Time: 5035-4656 PT Time Calculation (min): 52 min Charge: Manual [id number], TE [phone], MMT/ROM Measurement [phone]              Visit#: 7 of 15  Re-eval: 02/24/14 Assessment Diagnosis: Difficulty walking, ankle pain, and weakness.  Next MD Visit: Gilles Chiquito 02/16/2014 Prior Therapy: no  Authorization: [employer]    Authorization Time Period:    Authorization Visit#: 7 of 15   Subjective Symptoms/Limitations Symptoms: Pt reported last Wednesday increased ankle pain, was just normal work day.  Pt with painful ankle pop during OT session earlier today How long can you walk comfortably?: Able to walk 1 mile in 25 minutes Pain Assessment Currently in Pain?: Yes Pain Score: 5  Pain Location: Ankle Pain Orientation: Left  Precautions/Restrictions  Precautions Precautions: None  Assessment LLE AROM (degrees) Left Hip External Rotation :  (was 45) Left Hip Internal Rotation :  (was 31) Left Knee Flexion: 110 (was 80) Left Ankle Dorsiflexion: 12 (was 5) Left Ankle Plantar Flexion: 62 (was 60) Left Ankle Inversion: 50 (was 40) Left Ankle Eversion: 30 (was 10) LLE Strength Left Ankle Plantar Flexion: 3+/5  Exercise/Treatments Stretches Active Hamstring Stretch: Limitations Active Hamstring Stretch Limitations: 3way 10x to 8" box Soleus Stretch: 3 reps;30 seconds;Limitations Soleus Stretch Limitations: slant board  3 directions Standing Functional Squat: Limitations Functional Squat Limitations: 27 positions squats 2reps each directions Rocker Board: 2 minutes;Limitations Rocker Board Limitations: R/L and A/P intermittent HHA Other Standing Knee Exercises: 3D ankle and hip excursions 15x     Modalities Modalities: Cryotherapy Manual Therapy Manual Therapy: Massage Massage: STM to achilles tendon  and gastroc mm in prone position to reduce fascial restrictions and spasms Cryotherapy Number Minutes Cryotherapy: 8 Minutes Cryotherapy Location: Other (comment);Ankle Type of Cryotherapy: Ice massage  Physical Therapy Assessment and Plan PT Assessment and Plan Clinical Impression Statement: Pt with increased tightness and pain achilles and gastroc region, manual techniques complete to reduce spasms, multiple trigger points in gastroc region and to reduce pain.  Continued stretches to improve flexibilty for gastric and hamstrings.  Began squats in 27 positions to improve ankle ROM and improve functional strength.  ROM Measuresment, MMT, and reviewed goals prior MD apt tomorrow.  Pt will continue to  benefit from skilled intervention to improve ROM, functional strengthening reduce pain and improve activity tolerance to improve QOL. PT Plan: Recommmend continuning OPPT 2x week for 4 more weeks to improve ROM, strength and reduce pain with manual techniques.  Continue hip and ankle excursion (perform at wall next session) and SFT 27 positions squat matrix, Progress glut strength as tolerated.     Goals PT Short Term Goals PT Short Term Goal 1: Patient will be able to walk for 1 hour to perform regular work required activities with pain < 2/10 PT Short Term Goal 1 - Progress: Progressing toward goal PT Short Term Goal 2: Patient will demonstrate 4/5 gastroc strength to indicate improving ankle stability and ability to stand on toes to reach to top shelf of kictchen cabinet PT Short Term Goal 2 - Progress: Progressing toward goal PT Short Term Goal 3: Patient's dorsiflexion ROM to improve to 20 degrees to allow for increased shock absorption during gait and increase stride length PT Short Term Goal 3 - Progress: Progressing toward goal PT Short Term Goal 4: Increase great toe dorsiflexion to  60decrease to decrease early toe off during gait.  PT Short Term Goal 4 - Progress: Progressing toward  goal PT Long Term Goals PT Long Term Goal 1: Patient will be able to walk for 4 hours to perform regular work required activities and perform walking program for weight management program with no pain PT Long Term Goal 1 - Progress: Progressing toward goal PT Long Term Goal 2: Patient will demonstrate 5/5 gastroc strength to indicate improvied ankle stability and perform extended time on toes for working arouind his house.  PT Long Term Goal 2 - Progress: Progressing toward goal Long Term Goal 3: Patient's hip abduction and extension strength to improve to 5/5 to improve strength for squatting to the floor with feet flat.  Long Term Goal 3 Progress: Progressing toward goal  Problem List Patient Active Problem List   Diagnosis Date Noted  . Pain in joint, forearm 01/25/2014  . Muscle weakness (generalized) 01/25/2014  . Lateral epicondylitis (tennis elbow) 01/25/2014    PT - End of Session Activity Tolerance: Patient tolerated treatment well General Behavior During Therapy: [facility] for tasks assessed/performed  GP    Aldona Lento 02/14/2014, 1:24 PM  Physician Documentation Your signature is required to indicate approval of the treatment plan as stated above.  Please sign and either send electronically or make a copy of this report for your files and return this physician signed original.   Please mark one 1.__approve of plan  2. ___approve of plan with the following conditions.   ______________________________                                                          _____________________ Physician Signature                                                                                                             Date

## 2014-02-14 NOTE — Progress Notes (Addendum)
Occupational Therapy Treatment Patient Details  Name: Edward Barnes. MRN: [id number] Date of Birth: [birth year]  Today's Date: 02/14/2014 Time: 9628-3662 OT Time Calculation (min): 37 min Therex [phone] 37'  Visit#: 7 of 10  Re-eval: 02/22/14    Authorization:    Authorization Time Period:    Authorization Visit#:   of    Subjective Symptoms/Limitations Symptoms: S: Today is feels a lot better than the other day.  Pain Assessment Currently in Pain?: No/denies  Precautions/Restrictions  Precautions Precautions: None  Exercise/Treatments ROM / Strengthening / Isometric Strengthening UBE (Upper Arm Bike): 2.5 6' forward Cybex Press: 3 plate;20 reps Cybex Row: 3 plate;20 reps Elbow Exercises Elbow Flexion: Strengthening;20 reps (2#) Elbow Extension: Strengthening;20 reps (2#) Forearm Supination: Strengthening;Theraband;20 reps (2#) Theraband Level (Supination): Level 3 (Green) Forearm Pronation: Strengthening;Theraband;20 reps (2#) Theraband Level (Pronation): Level 3 (Green) Wrist Flexion: Strengthening;Theraband;20 reps (2#) Theraband Level (Wrist Flexion): Level 3 (Green) Wrist Extension: Strengthening;Theraband;20 reps (2#) Theraband Level (Wrist Extension): Level 3 (Green) Other elbow exercises: Weighted wrist flexion/extension; 5X up/down;3#   Wall Pushups/Modified Pushups: table pushups; 2x5; focus on slow down and up with elbow next to body. Some left ankle pain noted.  Theraputty:  (used pvc pipe to press into putty) Theraputty - Flatten: green-standing Theraputty - Roll: green - focus on extending and flexing elbow Theraputty - Grip: green - supination and pronation Hand Gripper with Medium Beads: 10 beads with black handgripper Hand Gripper with Small Beads: 10 beads with handgripper  Wrist Weighted Stretch Supination - Weighted Stretch: 2 pounds;2 minutes Wrist Flexion - Weighted Stretch: 2 pounds;2 minutes Wrist Extension - Weighted Stretch: 2  pounds;2 minutes         Occupational Therapy Assessment and Plan OT Assessment and Plan Clinical Impression Statement: A: No reports of pain this date. Increased to green theraband and added table push ups this date and tolerated well.  OT Plan: P: Increase to 3#   Goals Short Term Goals Time to Complete Short Term Goals: 2 weeks Short Term Goal 1: Patient will be educated on HEP. Short Term Goal 1 Progress: Progressing toward goal Short Term Goal 2: Patient report a pain score of 6/10 with daily tasks. Short Term Goal 2 Progress: Progressing toward goal Short Term Goal 3: Patient will increase AROM to WNL to increase ability to complete daily tasks with less difficulty.  Short Term Goal 3 Progress: Progressing toward goal Short Term Goal 4: Patient will increase Right elbow and wrist strength to 4+/5 to increase ability to lift heavy items overhead. Short Term Goal 4 Progress: Progressing toward goal Short Term Goal 5: Patient will increase grip strength by 10# and pinch strength by 2# to increase ability to lift and hold onto a heavy item such as a coffee. Short Term Goal 5 Progress: Progressing toward goal Additional Short Term Goals?: Yes Short Term Goal 6: Patient will decrease fascial restrictions from Mod to Min-Mod. Short Term Goal 6 Progress: Met Short Term Goal 7: Patient will decrease edema in right elbow region by being educated and participating in edema management techniques.  Short Term Goal 7 Progress: Met Long Term Goals Time to Complete Long Term Goals: 4 weeks Long Term Goal 1: Patient will return to highest level of independence with all BADL, work, and leisure tasks.  Long Term Goal 1 Progress: Progressing toward goal Long Term Goal 2: Patient will report a pain score of 4/10 or less with daily tasks.  Long Term Goal 2 Progress: Progressing toward goal  Long Term Goal 3: Patient will increase Right elbow and wrist strength to 5/5 to increase ability to lift  heavy items overhead.  Long Term Goal 3 Progress: Progressing toward goal Long Term Goal 4: Patient will increase grip strength by 20# and pinch strength by 4# to increase ability to lift and hold onto a heavy items. Long Term Goal 4 Progress: Progressing toward goal Long Term Goal 5: Patient will decrease fascial restrictions from min-mod to trace. Long Term Goal 5 Progress: Met  Problem List Patient Active Problem List   Diagnosis Date Noted  . Pain in joint, forearm 01/25/2014  . Muscle weakness (generalized) 01/25/2014  . Lateral epicondylitis (tennis elbow) 01/25/2014    End of Session Activity Tolerance: Patient tolerated treatment well General Behavior During Therapy: [facility] for tasks assessed/performed   Ailene Ravel, OTR/L,CBIS   02/14/2014, 8:40 AM

## 2014-02-14 NOTE — Progress Notes (Signed)
Physical Therapy Progress note prior MD apt /Treatment note  Patient Details  Name: Edward Barnes. MRN: [id number] Date of Birth: 05-11-1976  Today's Date: 02/14/2014 Time: 2703-5009 PT Time Calculation (min): 52 min Charge: Manual [id number], TE [phone], MMT/ROM Measurement [phone]              Visit#: 7 of 15  Re-eval: 02/24/14 Assessment Diagnosis: Difficulty walking, ankle pain, and weakness.  Next MD Visit: Gilles Chiquito 02/16/2014 Prior Therapy: no  Authorization: [employer]    Authorization Time Period:    Authorization Visit#: 7 of 15   Subjective Symptoms/Limitations Symptoms: Pt reported last Wednesday increased ankle pain, was just normal work day.  Pt with painful ankle pop during OT session earlier today How long can you walk comfortably?: Able to walk 1 mile in 25 minutes Pain Assessment Currently in Pain?: Yes Pain Score: 5  Pain Location: Ankle Pain Orientation: Left  Precautions/Restrictions  Precautions Precautions: None  Assessment LLE AROM (degrees) Left Hip External Rotation :  (was 45) Left Hip Internal Rotation :  (was 31) Left Knee Flexion: 110 (was 80) Left Ankle Dorsiflexion: 12 (was 5) Left Ankle Plantar Flexion: 62 (was 60) Left Ankle Inversion: 50 (was 40) Left Ankle Eversion: 30 (was 10) LLE Strength Left Ankle Plantar Flexion: 3+/5  Exercise/Treatments Stretches Active Hamstring Stretch: Limitations Active Hamstring Stretch Limitations: 3way 10x to 8" box Soleus Stretch: 3 reps;30 seconds;Limitations Soleus Stretch Limitations: slant board  3 directions Standing Functional Squat: Limitations Functional Squat Limitations: 27 positions squats 2reps each directions Rocker Board: 2 minutes;Limitations Rocker Board Limitations: R/L and A/P intermittent HHA Other Standing Knee Exercises: 3D ankle and hip excursions 15x     Modalities Modalities: Cryotherapy Manual Therapy Manual Therapy: Massage Massage: STM to achilles tendon  and gastroc mm in prone position to reduce fascial restrictions and spasms Cryotherapy Number Minutes Cryotherapy: 8 Minutes Cryotherapy Location: Other (comment);Ankle Type of Cryotherapy: Ice massage  Physical Therapy Assessment and Plan PT Assessment and Plan Clinical Impression Statement: Pt with increased tightness and pain achilles and gastroc region, manual techniques complete to reduce spasms, multiple trigger points in gastroc region and to reduce pain.  Continued stretches to improve flexibilty for gastric and hamstrings.  Began squats in 27 positions to improve ankle ROM and improve functional strength.  ROM Measuresment, MMT, and reviewed goals prior MD apt tomorrow.  Pt will continue to  benefit from skilled intervention to improve ROM, functional strengthening reduce pain and improve activity tolerance to improve QOL. PT Plan: Recommmend continuning OPPT 2x week for 4 more weeks to improve ROM, strength and reduce pain with manual techniques.  Continue hip and ankle excursion (perform at wall next session) and SFT 27 positions squat matrix, Progress glut strength as tolerated.     Goals PT Short Term Goals PT Short Term Goal 1: Patient will be able to walk for 1 hour to perform regular work required activities with pain < 2/10 PT Short Term Goal 1 - Progress: Progressing toward goal PT Short Term Goal 2: Patient will demonstrate 4/5 gastroc strength to indicate improving ankle stability and ability to stand on toes to reach to top shelf of kictchen cabinet PT Short Term Goal 2 - Progress: Progressing toward goal PT Short Term Goal 3: Patient's dorsiflexion ROM to improve to 20 degrees to allow for increased shock absorption during gait and increase stride length PT Short Term Goal 3 - Progress: Progressing toward goal PT Short Term Goal 4: Increase great toe dorsiflexion to  60decrease to decrease early toe off during gait.  PT Short Term Goal 4 - Progress: Progressing toward  goal PT Long Term Goals PT Long Term Goal 1: Patient will be able to walk for 4 hours to perform regular work required activities and perform walking program for weight management program with no pain PT Long Term Goal 1 - Progress: Progressing toward goal PT Long Term Goal 2: Patient will demonstrate 5/5 gastroc strength to indicate improvied ankle stability and perform extended time on toes for working arouind his house.  PT Long Term Goal 2 - Progress: Progressing toward goal Long Term Goal 3: Patient's hip abduction and extension strength to improve to 5/5 to improve strength for squatting to the floor with feet flat.  Long Term Goal 3 Progress: Progressing toward goal  Problem List Patient Active Problem List   Diagnosis Date Noted  . Pain in joint, forearm 01/25/2014  . Muscle weakness (generalized) 01/25/2014  . Lateral epicondylitis (tennis elbow) 01/25/2014    PT - End of Session Activity Tolerance: Patient tolerated treatment well General Behavior During Therapy: [facility] for tasks assessed/performed  GP    Aldona Lento 02/14/2014, 1:24 PM  Physician Documentation Your signature is required to indicate approval of the treatment plan as stated above.  Please sign and either send electronically or make a copy of this report for your files and return this physician signed original.   Please mark one 1.__approve of plan  2. ___approve of plan with the following conditions.   ______________________________                                                          _____________________ Physician Signature                                                                                                             Date

## 2014-02-16 ENCOUNTER — Inpatient Hospital Stay (HOSPITAL_COMMUNITY): Admission: RE | Admit: 2014-02-16 | Payer: 59 | Source: Ambulatory Visit

## 2014-02-22 ENCOUNTER — Ambulatory Visit (HOSPITAL_COMMUNITY)
Admission: RE | Admit: 2014-02-22 | Discharge: 2014-02-22 | Disposition: A | Discharge status: Home / Self Care | Payer: 59 | Source: Ambulatory Visit | Attending: Internal Medicine | Admitting: Internal Medicine

## 2014-02-22 NOTE — Progress Notes (Signed)
Physical Therapy Treatment Patient Details  Name: Janiel Crisostomo. MRN: [id number] Date of Birth: 09-20-1976  Today's Date: 02/22/2014 Time: 0800-0838 PT Time Calculation (min): 38 min    Charges: TherEx [id number], Manual [id number] Visit#: 8 of 20  Re-eval: 02/24/14 Assessment Diagnosis: Difficulty walking, ankle pain, and weakness.  Next MD Visit: Gilles Chiquito 02/16/2014 Prior Therapy: no  Authorization: [employer]  Authorization Visit#: 8 of 20   Subjective: Symptoms/Limitations Symptoms: patient reports no pain recently following a trip to [facility] where he did a lot of walkign patient feels very good, and retyurn to therapy today with a new prescription from his doctor for twice a week for 4 more weeks.  Pain Assessment Currently in Pain?: No/denies  Precautions/Restrictions  Precautions Precautions: None  Exercise/Treatments Mobility/Balance        Stretches Gastroc Stretch: Limitations;3 reps;30 seconds Gastroc Stretch Limitations: slant board 3 way 10x 3seconds Soleus Stretch: 3 reps;30 seconds;Limitations Soleus Stretch Limitations: slant board  3 directions Standing Heel Raises: 10 reps;Limitations Heel Raises Limitations: toe raises 3way  Forward Lunges: Both;10 reps;Limitations Forward Lunges Limitations: on 8in box Side Lunges: Both;10 reps;Limitations Side Lunges Limitations: on to 8" box Functional Squat: Limitations Functional Squat Limitations: 27 positions squats 2reps each directions Rocker Board: 2 minutes;Limitations Rocker Board Limitations: A/P intermittent HHA Other Standing Knee Exercises: 3 way flamengo 10x,  Other Standing Knee Exercises: 3D ankle 10x    Manual Therapy Joint Mobilization: anterior to posterior talar glides to increase dorsiflexion  Physical Therapy Assessment and Plan PT Assessment and Plan Clinical Impression Statement: patient had no pain throughout session and responded well to further intro ducion and reintroduction of  strengthenign exercises. Patient will benefit from continued strengthening to increase achilles tendon strength and integrity and return to full function for attainin long term goals. Patient cotnined to display limited, thoguh improving, plantar flexion strength, talocrural mobility and hip abduction/extension strength.  Therapy to continue for 4 more weeks for 2 to 3 visits per week per MD. PT Plan: Contineu progressing ankle dorsiflexion ROM by introducing 3D ankle excursion at wall next seesion, and continue progressing talocrural mobiliy, ankle stability and single leg balance strengthening. progress lunges to 4" box.    Goals    Problem List Patient Active Problem List   Diagnosis Date Noted  . Pain in joint, forearm 01/25/2014  . Muscle weakness (generalized) 01/25/2014  . Lateral epicondylitis (tennis elbow) 01/25/2014    General Behavior During Therapy: [facility] for tasks assessed/performed PT Plan of Care PT Home Exercise Plan: 3 way calf stretch,  hip flexion, hamstring and groin stretches, 3D ankle excursions twice daily.   GP    Erez Mccallum R Fedor Kazmierski 02/22/2014, 8:42 AM

## 2014-02-24 ENCOUNTER — Ambulatory Visit (HOSPITAL_COMMUNITY)
Admission: RE | Admit: 2014-02-24 | Discharge: 2014-02-24 | Disposition: A | Discharge status: Home / Self Care | Payer: 59 | Source: Ambulatory Visit | Attending: Internal Medicine | Admitting: Internal Medicine

## 2014-02-24 NOTE — Evaluation (Signed)
Physical Therapy ReAssessment  Patient Details  Name: Edward Barnes. MRN: [id number] Date of Birth: [birth year]  Today's Date: 02/24/2014 Time: 0800-0845 PT Time Calculation (min): 45 min    Charges: TherEx [phone]          Visit#: 9 of 20  Re-eval: 02/24/14 Assessment Diagnosis: Difficulty walking, ankle pain, and weakness.  Next MD Visit: Gilles Chiquito 02/16/2014 Prior Therapy: no  Authorization: [employer]    Authorization Time Period:    Authorization Visit#: 9 of 20   Past Medical History: No past medical history on file. Past Surgical History: No past surgical history on file.  Subjective Symptoms/Limitations Symptoms: Patient contineus to report no pain and that he is feeling good.  Precautions/Restrictions  Precautions Precautions: None  Cognition/Observation Observation/Other Assessments Observations: FOTO: was 54% limited curerently 32% limited  Assessment LLE AROM (degrees) Left Hip External Rotation : 45 (was 45) Left Hip Internal Rotation : 35 Left Knee Flexion: 110 (was 80) Left Ankle Dorsiflexion: 12 (was 5) Left Ankle Plantar Flexion: 62 (was 60) Left Ankle Inversion: 50 (was 40) Left Ankle Eversion: 30 (was 10) LLE Strength Left Ankle Plantar Flexion: 3+/5 (18reps)  Exercise/Treatments Stretches Active Hamstring Stretch: Limitations Active Hamstring Stretch Limitations: 3way 10x to 8" box Hip Flexor Stretch: Limitations Hip Flexor Stretch Limitations: 10x 3 way to 8" box Gastroc Stretch: Limitations;3 reps;30 seconds Gastroc Stretch Limitations: slant board 3 way 10x 3seconds Soleus Stretch: 3 reps;30 seconds;Limitations Soleus Stretch Limitations: slant board  3 directions Standing Forward Lunges: Both;10 reps;Limitations Forward Lunges Limitations: on 4in box Side Lunges: Both;10 reps;Limitations Side Lunges Limitations: on to 4" box Functional Squat: Limitations Functional Squat Limitations: 27 positions squats 2reps each direction with  calf raise Other Standing Knee Exercises: 3 way flamengo 10x,  Other Standing Knee Exercises: 3D ankle excursion at wall 10x   Single leg balance reach matrix common with slider 5x    Physical Therapy Assessment and Plan PT Assessment and Plan Clinical Impression Statement: Patient demosntrates good progress towards all goals with significantly decreased pain and improving ROM. Patient continues to display weakness in gastroc soleus, but ROM is much improved. Following gstretches patient's gait is much improved, though he continues to have an excessive knee valgus moment secondary to limited glut strength. Therapy will continue with focus on progressing strength and stability.  PT Plan: Contineu progressing ankle dorsiflexion ROM by introducing 3D ankle excursion at wall next seesion, and continue progressing talocrural mobiliy, ankle stability and single leg balance strengthening. progress lunges to 4" box. introdue single leg balance reach matrix common with slider    Goals PT Short Term Goals PT Short Term Goal 1: Patient will be able to walk for 1 hour to perform regular work required activities with pain < 2/10 PT Short Term Goal 1 - Progress: Met PT Short Term Goal 2: Patient will demonstrate 4/5 gastroc strength to indicate improving ankle stability and ability to stand on toes to reach to top shelf of kictchen cabinet PT Short Term Goal 2 - Progress: Progressing toward goal PT Short Term Goal 3: Patient's dorsiflexion ROM to improve to 20 degrees to allow for increased shock absorption during gait and increase stride length PT Short Term Goal 3 - Progress: Progressing toward goal PT Short Term Goal 4: Increase great toe dorsiflexion to 60decrease to decrease early toe off during gait.  PT Short Term Goal 4 - Progress: Progressing toward goal PT Long Term Goals PT Long Term Goal 1: Patient will be able to walk  for 4 hours to perform regular work required activities and perform walking  program for weight management program with no pain PT Long Term Goal 1 - Progress: Progressing toward goal PT Long Term Goal 2: Patient will demonstrate 5/5 gastroc strength to indicate improvied ankle stability and perform extended time on toes for working arouind his house.  PT Long Term Goal 2 - Progress: Progressing toward goal Long Term Goal 3: Patient's hip abduction and extension strength to improve to 5/5 to improve strength for squatting to the floor with feet flat.  Long Term Goal 3 Progress: Progressing toward goal  Problem List Patient Active Problem List   Diagnosis Date Noted  . Pain in joint, forearm 01/25/2014  . Muscle weakness (generalized) 01/25/2014  . Lateral epicondylitis (tennis elbow) 01/25/2014    PT - End of Session Activity Tolerance: Patient tolerated treatment well General Behavior During Therapy: [facility] for tasks assessed/performed PT Plan of Care PT Home Exercise Plan: 3 way calf stretch,  hip flexion, hamstring and groin stretches, 3D ankle excursions twice daily.   GP    Suzette Battiest Airlie Blumenberg 02/24/2014, 8:39 AM  Physician Documentation Your signature is required to indicate approval of the treatment plan as stated above.  Please sign and either send electronically or make a copy of this report for your files and return this physician signed original.   Please mark one 1.__approve of plan  2. ___approve of plan with the following conditions.   ______________________________                                                          _____________________ Physician Signature                                                                                                             Date

## 2014-02-28 ENCOUNTER — Ambulatory Visit (HOSPITAL_COMMUNITY): Payer: 59

## 2014-03-02 ENCOUNTER — Ambulatory Visit (HOSPITAL_COMMUNITY)
Admission: RE | Admit: 2014-03-02 | Discharge: 2014-03-02 | Disposition: A | Discharge status: Home / Self Care | Payer: 59 | Source: Ambulatory Visit | Attending: Internal Medicine | Admitting: Internal Medicine

## 2014-03-02 DIAGNOSIS — M6281 Muscle weakness (generalized): Secondary | ICD-10-CM | POA: Insufficient documentation

## 2014-03-02 DIAGNOSIS — IMO0001 Reserved for inherently not codable concepts without codable children: Secondary | ICD-10-CM | POA: Insufficient documentation

## 2014-03-02 DIAGNOSIS — M25529 Pain in unspecified elbow: Secondary | ICD-10-CM | POA: Insufficient documentation

## 2014-03-02 DIAGNOSIS — M7710 Lateral epicondylitis, unspecified elbow: Secondary | ICD-10-CM | POA: Insufficient documentation

## 2014-03-02 NOTE — Evaluation (Signed)
Occupational Therapy Reassessment and Discharge  Patient Details  Name: Edward Barnes. MRN: [id number] Date of Birth: [birth year]  Today's Date: 03/02/2014 Time: 7619-5093 OT Time Calculation (min): 41 min Therex [phone] 26' Reassess [phone] 15'  Visit#: 8 of 10  Re-eval:    Assessment Diagnosis: Right Lateral Epocondylitis  Authorization:    Authorization Time Period:    Authorization Visit#:   of     Past Medical History: No past medical history on file. Past Surgical History: No past surgical history on file.  Subjective Symptoms/Limitations Symptoms: S: My elbow feels great. It hasn't been bothering me.  Special Tests: FOTO score: 76/100 ( on eval: 57/100) Pain Assessment Currently in Pain?: No/denies  Precautions/Restrictions  Precautions Precautions: None   Assessment Sensation/Coordination/Edema Edema Edema: Right: 33 cm (same at eval)  Additional Assessments RUE AROM (degrees) Right Elbow Flexion: 70 (on eval: 50) Right Forearm Supination: 61 Degrees (60 on eval) Right Wrist Extension: 70 Degrees (70 on eval) Right Wrist Flexion: 80 Degrees (60 on eval) Right Wrist Radial Deviation: 31 Degrees (20 on eval) Right Wrist Ulnar Deviation: 36 Degrees (32 on eval) RUE Strength Right Elbow Flexion: 5/5 Right Elbow Extension: 5/5 Right Forearm Pronation: 5/5 Right Forearm Supination: 5/5 Right Wrist Flexion: 5/5 Right Wrist Extension: 5/5 Right Wrist Radial Deviation: 5/5 Right Wrist Ulnar Deviation: 5/5 Grip (lbs): 130 (on eval: 60) Lateral Pinch: 27 lbs (26) 3 Point Pinch: 23 lbs (18) Palpation Palpation: Trace fascial restrictions in lateral epicondylitis of right elbow   Exercise/Treatments ROM / Strengthening / Isometric Strengthening UBE (Upper Arm Bike): 3.0 6' forward Cybex Press: 20 reps (6 plate) Cybex Row: 20 reps (6 plate)   Elbow Exercises Forearm Supination: Strengthening;Theraband;20 reps (3#) Theraband Level (Supination): Level  3 (Green) Forearm Pronation: Strengthening;Theraband;20 reps (3#) Theraband Level (Pronation): Level 3 (Green) Wrist Flexion: Strengthening;Theraband;20 reps (3#) Theraband Level (Wrist Flexion): Level 3 (Green) Wrist Extension: Strengthening;Theraband;20 reps Theraband Level (Wrist Extension): Level 3 (Green)            Occupational Therapy Assessment and Plan OT Assessment and Plan Clinical Impression Statement: A: Reassessment and discharge completed this date. patient has met 7/7 STGs and 4.5/5 LTGs. HEP has been updated and reviewed. OT Plan: P: D/C from therapy.   Goals Short Term Goals Time to Complete Short Term Goals: 2 weeks Short Term Goal 1: Patient will be educated on HEP. Short Term Goal 1 Progress: Met Short Term Goal 2: Patient report a pain score of 6/10 with daily tasks. Short Term Goal 2 Progress: Met Short Term Goal 3: Patient will increase AROM to WNL to increase ability to complete daily tasks with less difficulty.  Short Term Goal 3 Progress: Met Short Term Goal 4: Patient will increase Right elbow and wrist strength to 4+/5 to increase ability to lift heavy items overhead. Short Term Goal 4 Progress: Met Short Term Goal 5: Patient will increase grip strength by 10# and pinch strength by 2# to increase ability to lift and hold onto a heavy item such as a coffee. Short Term Goal 5 Progress: Met Additional Short Term Goals?: Yes Short Term Goal 6: Patient will decrease fascial restrictions from Mod to Min-Mod. Short Term Goal 6 Progress: Met Short Term Goal 7: Patient will decrease edema in right elbow region by being educated and participating in edema management techniques.  Long Term Goals Time to Complete Long Term Goals: 4 weeks Long Term Goal 1: Patient will return to highest level of independence with all BADL,  work, and leisure tasks.  Long Term Goal 1 Progress: Met Long Term Goal 2: Patient will report a pain score of 4/10 or less with daily tasks.   Long Term Goal 2 Progress: Met Long Term Goal 3: Patient will increase Right elbow and wrist strength to 5/5 to increase ability to lift heavy items overhead.  Long Term Goal 3 Progress: Met Long Term Goal 4: Patient will increase grip strength by 20# and pinch strength by 4# to increase ability to lift and hold onto a heavy items. Long Term Goal 4 Progress: Partly met Long Term Goal 5: Patient will decrease fascial restrictions from min-mod to trace. Long Term Goal 5 Progress: Met  Problem List Patient Active Problem List   Diagnosis Date Noted  . Pain in joint, forearm 01/25/2014  . Muscle weakness (generalized) 01/25/2014  . Lateral epicondylitis (tennis elbow) 01/25/2014    End of Session Activity Tolerance: Patient tolerated treatment well General Behavior During Therapy: [facility] for tasks assessed/performed OT Plan of Care OT Home Exercise Plan: Lateral Epicondylitis HEP OT Patient Instructions: handout (scanned) Consulted and Agree with Plan of Care: Patient   Ailene Ravel, OTR/L,CBIS   03/02/2014, 8:56 AM  Physician Documentation Your signature is required to indicate approval of the treatment plan as stated above.  Please sign and either send electronically or make a copy of this report for your files and return this physician signed original.  Please mark one 1.__approve of plan  2. ___approve of plan with the following conditions.   ______________________________                                                          _____________________ Physician Signature                                                                                                             Date

## 2014-03-02 NOTE — Progress Notes (Signed)
Physical Therapy Treatment Patient Details  Name: Edward Barnes. MRN: [id number] Date of Birth: 1976/07/09  Today's Date: 03/02/2014 Time: 7902-4097 PT Time Calculation (min): 30 min  Visit#: 10 of 20  Re-eval: 02/24/14 Authorization: Christella Scheuermann  Authorization Visit#: 10 of 20  Charges:  therex 30'  Subjective: Symptoms/Limitations Symptoms: Pt states no pain with overall improvment.  Special Tests: FOTO score: 76/100 ( on eval: 57/100) Pain Assessment Currently in Pain?: No/denies  Precautions/Restrictions  Precautions Precautions: None  Session began by [employer], PT [phone]  Exercise/Treatments Stretches Gastroc Stretch: Limitations;3 reps;30 seconds Gastroc Stretch Limitations: slant board 3 way 10x 3seconds Soleus Stretch: 3 reps;30 seconds;Limitations Soleus Stretch Limitations: slant board  3 directions standing Forward Lunges: Both;10 reps;Limitations Forward Lunges Limitations: on 4in box Side Lunges: Both;10 reps;Limitations Side Lunges Limitations: on to 4" box Functional Squat: Limitations Functional Squat Limitations: 27 positions squats 2reps each direction with calf raise  SLS: singl leg balance reach matrix common with slider, no UE support 5x  Other Standing Knee Exercises: 3 way flamengo 10x,  Other Standing Knee Exercises: 3D ankle excursion at wall 10x   Seated Other Seated Knee Exercises: standing eccentric calf raise single leg on slant board 10 reps each    Physical Therapy Assessment and Plan PT Assessment and Plan Clinical Impression Statement: continued progress as illustration through increased mobility with activities.  Continues to require therapist facilitation to perform activities with proper alignment without substitution.  continue to focus on activities that promotes increased pronation of feet and increasing overall ankle stabilty.  Able to decrease to 4" step wtih lunges and added heelraise on slant board with eccentric focus.  Pt  reported burining in calves with this activity.   PT Plan: Continue to progress talocrural mobility and overall ankle stablity bilaterally.       Problem List Patient Active Problem List   Diagnosis Date Noted  . Pain in joint, forearm 01/25/2014  . Muscle weakness (generalized) 01/25/2014  . Lateral epicondylitis (tennis elbow) 01/25/2014    PT - End of Session Activity Tolerance: Patient tolerated treatment well General Behavior During Therapy: WFL for tasks assessed/performed   Teena Irani, PTA/CLT 03/02/2014, 9:26 AM

## 2014-03-08 ENCOUNTER — Ambulatory Visit (HOSPITAL_COMMUNITY): Payer: 59 | Admitting: Physical Therapy

## 2014-03-08 ENCOUNTER — Telehealth (HOSPITAL_COMMUNITY): Payer: Self-pay

## 2014-03-09 ENCOUNTER — Ambulatory Visit (HOSPITAL_COMMUNITY): Payer: 59 | Admitting: Physical Therapy

## 2014-03-14 ENCOUNTER — Inpatient Hospital Stay (HOSPITAL_COMMUNITY): Admission: RE | Admit: 2014-03-14 | Payer: 59 | Source: Ambulatory Visit | Admitting: Physical Therapy

## 2014-03-14 ENCOUNTER — Telehealth (HOSPITAL_COMMUNITY): Payer: Self-pay

## 2014-03-16 ENCOUNTER — Ambulatory Visit (HOSPITAL_COMMUNITY): Payer: 59 | Admitting: Physical Therapy

## 2014-03-21 ENCOUNTER — Ambulatory Visit (HOSPITAL_COMMUNITY): Payer: 59 | Admitting: Physical Therapy

## 2014-03-23 ENCOUNTER — Ambulatory Visit (HOSPITAL_COMMUNITY): Payer: 59 | Admitting: Physical Therapy

## 2014-05-16 NOTE — Progress Notes (Signed)
Discharge Patient Details  Name: Edward Barnes. MRN: [id number] Date of Birth: 1976-09-24  Today's Date: 05/16/2014  Patient left message stating he wound not be returning to therapy as he felt great and no longer required help. Pt completed 10 visits.   Takima Encina R 05/16/2014, 9:05 AM

## 2014-05-16 NOTE — Addendum Note (Signed)
Encounter addended by: Leia Alf, PT on: 05/16/2014  9:07 AM<BR>     Documentation filed: Letters, Notes Section, Episodes

## 2018-11-25 ENCOUNTER — Other Ambulatory Visit: Payer: Self-pay | Admitting: Orthopedic Surgery

## 2018-11-25 DIAGNOSIS — D3610 Benign neoplasm of peripheral nerves and autonomic nervous system, unspecified: Secondary | ICD-10-CM

## 2018-11-28 ENCOUNTER — Ambulatory Visit
Admission: RE | Admit: 2018-11-28 | Discharge: 2018-11-28 | Disposition: A | Discharge status: Home / Self Care | Payer: 59 | Source: Ambulatory Visit | Attending: Orthopedic Surgery | Admitting: Orthopedic Surgery

## 2018-11-28 DIAGNOSIS — D3610 Benign neoplasm of peripheral nerves and autonomic nervous system, unspecified: Secondary | ICD-10-CM

## 2019-12-25 IMAGING — MR MR FOOT*R* W/O CM
5 series · 40 of 40 positions shown · non-contrast
Comparison: None.

CLINICAL DATA: Right medial pain, tingling near arch of foot x 4-5
weeks. No prior surgery to foot. Pt states standing, walking w/o
shoes makes pain worse.

EXAM:
MRI OF THE RIGHT FOREFOOT WITHOUT CONTRAST
TECHNIQUE: Multiplanar, multisequence MR imaging of the right forefoot was
performed. No intravenous contrast was administered.

[Series 4: T1 · coronal · right · 3.0mm · 0.31mm/px · 12 of 49 slices shown (1 of 2)]
[im 1/49]
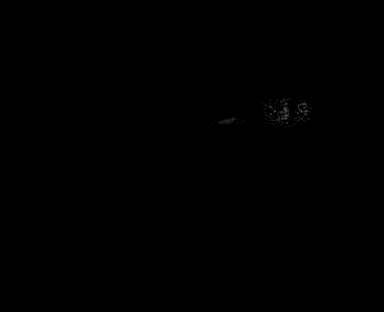
[im 5/49]
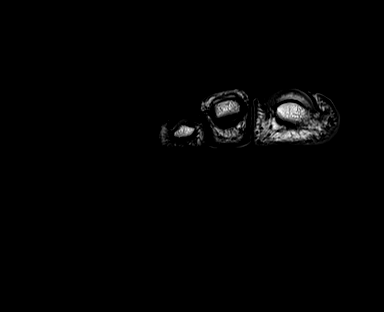
[im 9/49]
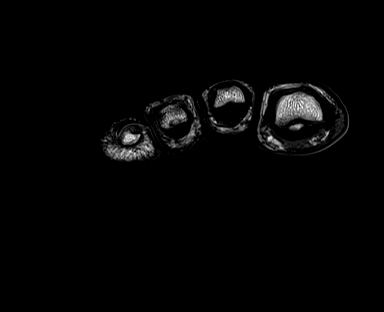
[im 14/49]
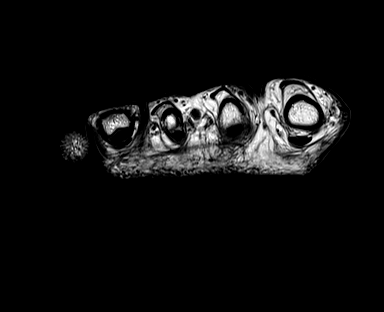
[im 18/49]
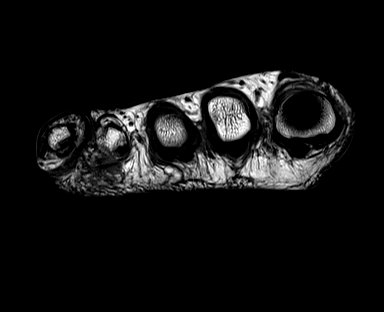
[im 22/49]
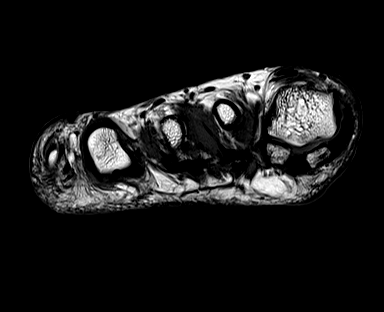
[im 27/49]
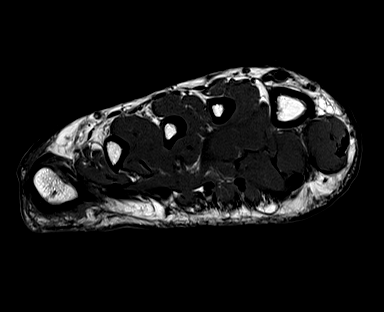
[im 31/49]
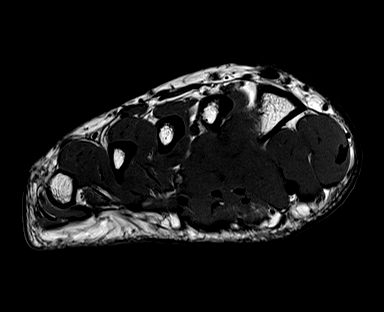
[im 35/49]
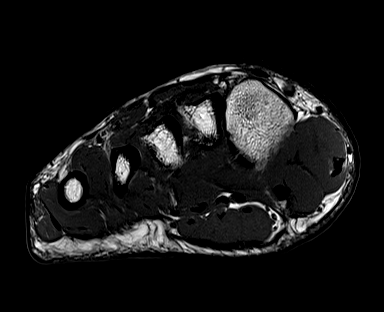
[im 40/49]
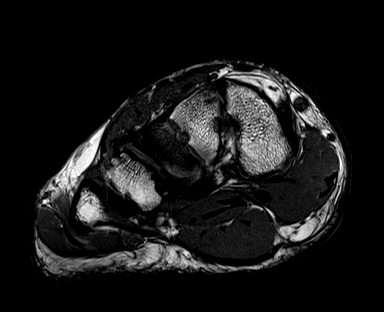
[im 44/49]
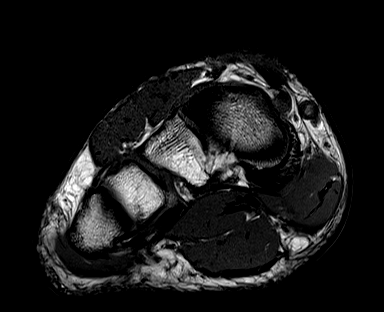
[im 49/49]
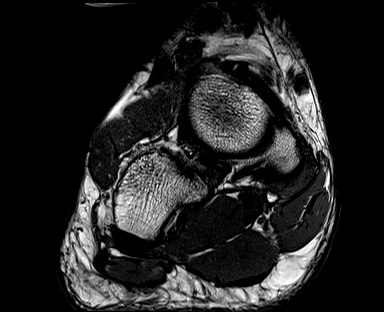

[Series 5: T2 fat-sat · coronal · right · 3.0mm · 0.31mm/px · 11 of 49 slices shown (1 of 2)]
[im 1/49]
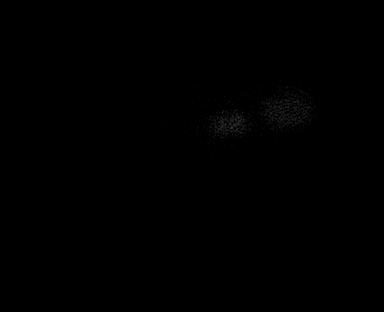
[im 5/49]
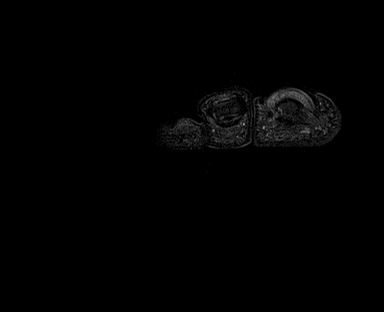
[im 10/49]
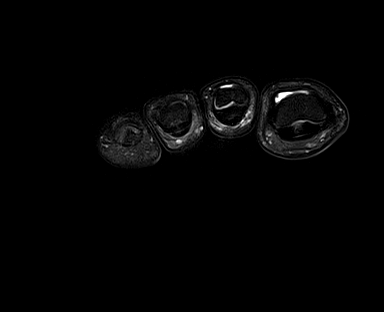
[im 15/49]
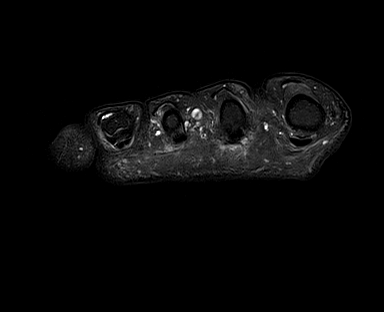
[im 20/49]
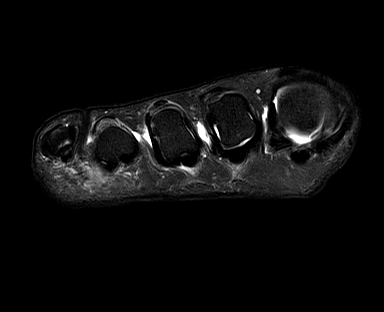
[im 25/49]
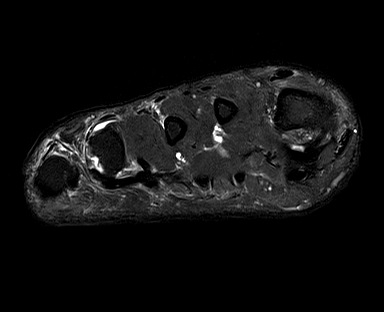
[im 29/49]
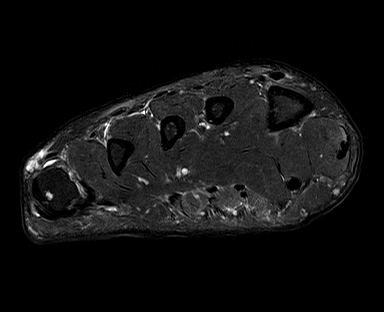
[im 34/49]
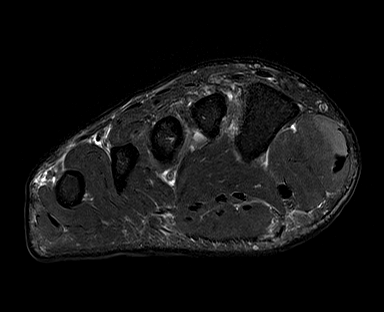
[im 39/49]
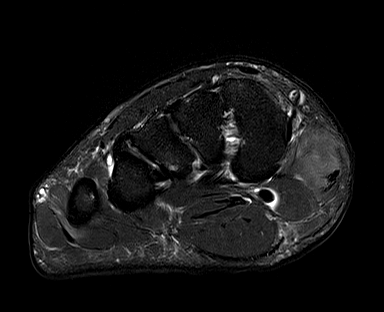
[im 44/49]
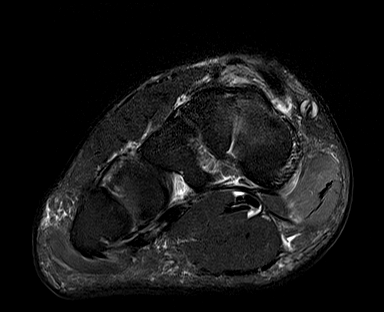
[im 49/49]
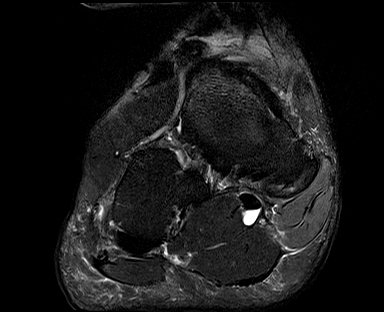

[Series 6: STIR · sagittal · right · 3.0mm · 0.70mm/px · 7 of 29 slices shown]
[im 1/29]
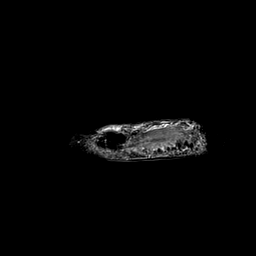
[im 5/29]
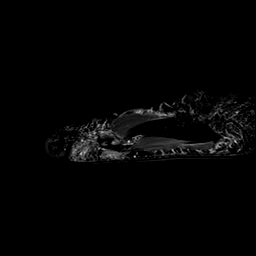
[im 10/29]
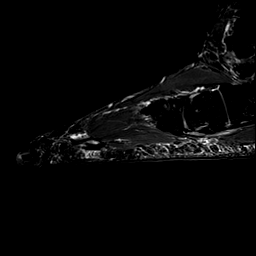
[im 15/29]
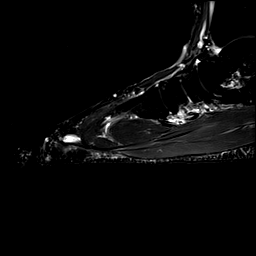
[im 19/29]
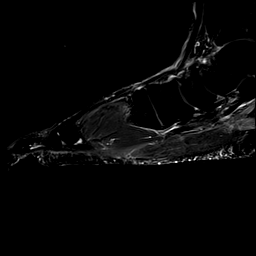
[im 24/29]
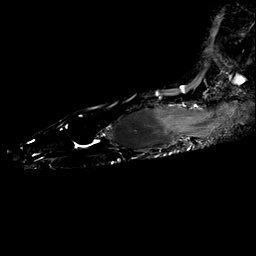
[im 29/29]
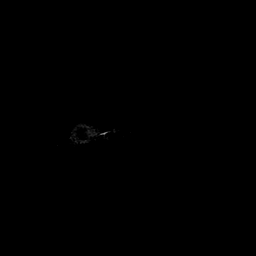

[Series 7: T1 · axial · right · 3.0mm · 0.49mm/px · z∈[-130,-49]mm · 5 of 23 slices shown (2 of 2)]
[im 1/23]
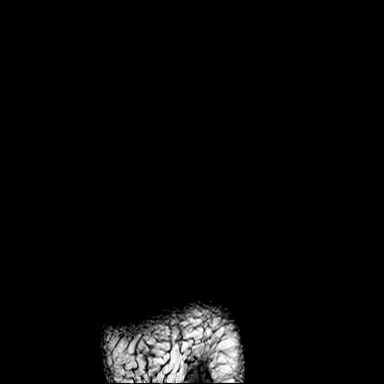
[im 6/23]
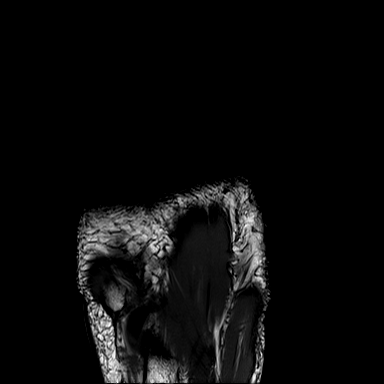
[im 12/23]
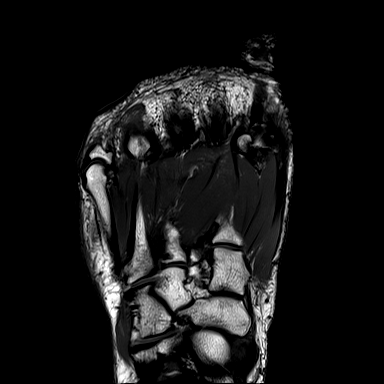
[im 17/23]
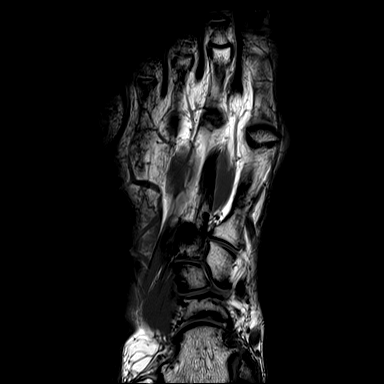
[im 23/23]
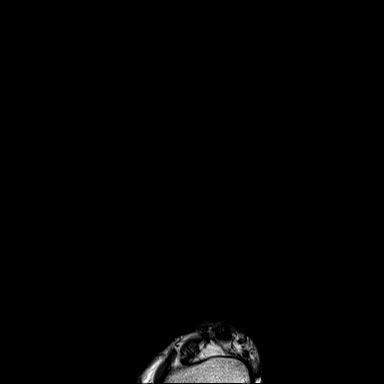

[Series 8: T2 fat-sat · axial · right · 3.0mm · 0.47mm/px · z∈[-128,-47]mm · 5 of 23 slices shown (2 of 2)]
[im 1/23]
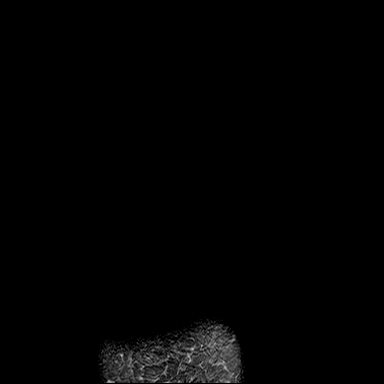
[im 6/23]
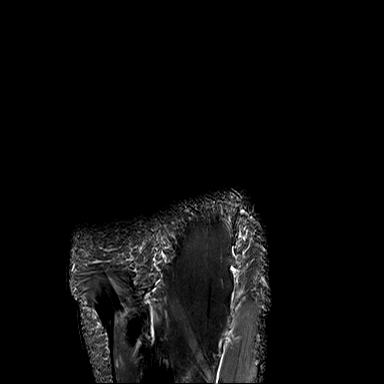
[im 12/23]
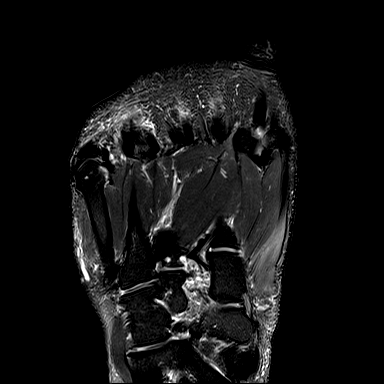
[im 17/23]
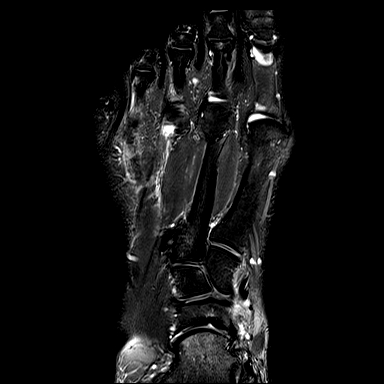
[im 23/23]
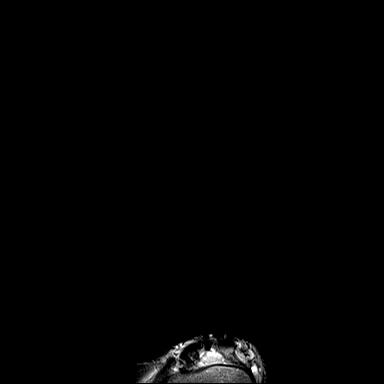

[40 of 40 positions shown; findings below may reference images not displayed]

FINDINGS: Bones/Joint/Cartilage

No marrow signal abnormality. No fracture or dislocation. Normal
alignment. No joint effusion. Mild osteoarthritis of the
talonavicular joint. Mild osteoarthritis of the first MTP joint.

Ligaments

Collateral ligaments are intact.  Lisfranc ligament is intact.

Muscles and Tendons
Flexor, peroneal and extensor compartment tendons are intact. Small
amount of fluid in the flexor hallucis longus tendon sheath. Mild
muscle edema in the left abductor hallucis muscle concerning for
mild muscle strain versus myositis.

Soft tissue
No fluid collection or hematoma. No soft tissue mass. Small amount
of fluid in the intermetatarsal bursa between the first and second,
second and third, and third and fourth metatarsal heads.
IMPRESSION: 1. Mild muscle edema in the left abductor hallucis muscle concerning
for mild muscle strain versus myositis.
2. Mild tenosynovitis of the flexor hallucis longus.
3. Intermetatarsal bursitis between the first and second, second and
third, and third and fourth metatarsal heads.
4. No soft tissue mass of the right forefoot.

## 2020-06-12 ENCOUNTER — Other Ambulatory Visit (HOSPITAL_COMMUNITY): Payer: Self-pay | Admitting: Orthopedic Surgery

## 2020-06-12 DIAGNOSIS — M79604 Pain in right leg: Secondary | ICD-10-CM

## 2020-06-13 ENCOUNTER — Other Ambulatory Visit: Payer: Self-pay

## 2020-06-13 ENCOUNTER — Ambulatory Visit (HOSPITAL_COMMUNITY)
Admission: RE | Admit: 2020-06-13 | Discharge: 2020-06-13 | Disposition: A | Discharge status: Home / Self Care | Payer: 59 | Source: Ambulatory Visit | Attending: Orthopedic Surgery | Admitting: Orthopedic Surgery

## 2020-06-13 DIAGNOSIS — M7989 Other specified soft tissue disorders: Secondary | ICD-10-CM | POA: Insufficient documentation

## 2020-06-13 DIAGNOSIS — M79604 Pain in right leg: Secondary | ICD-10-CM | POA: Insufficient documentation

## 2020-06-13 NOTE — Progress Notes (Signed)
Right lower extremity venous duplex has been completed. Preliminary results can be found in CV Proc through chart review.  Results were faxed to Dr. Charlestine Night office.  06/13/20 9:01 AM Carlos Levering RVT

## 2020-06-20 ENCOUNTER — Other Ambulatory Visit: Payer: Self-pay | Admitting: Orthopedic Surgery

## 2020-06-20 DIAGNOSIS — M79604 Pain in right leg: Secondary | ICD-10-CM

## 2020-07-11 ENCOUNTER — Other Ambulatory Visit: Payer: 59

## 2021-01-23 ENCOUNTER — Other Ambulatory Visit (HOSPITAL_BASED_OUTPATIENT_CLINIC_OR_DEPARTMENT_OTHER): Payer: Self-pay

## 2021-01-23 DIAGNOSIS — G2581 Restless legs syndrome: Secondary | ICD-10-CM

## 2021-01-23 DIAGNOSIS — R0681 Apnea, not elsewhere classified: Secondary | ICD-10-CM

## 2021-02-06 ENCOUNTER — Encounter: Payer: 59 | Admitting: Neurology

## 2021-02-07 ENCOUNTER — Other Ambulatory Visit: Payer: Self-pay

## 2021-02-07 ENCOUNTER — Ambulatory Visit: Payer: 59 | Attending: Otolaryngology | Admitting: Neurology

## 2021-02-07 DIAGNOSIS — G2581 Restless legs syndrome: Secondary | ICD-10-CM

## 2021-02-07 DIAGNOSIS — G4733 Obstructive sleep apnea (adult) (pediatric): Secondary | ICD-10-CM | POA: Insufficient documentation

## 2021-02-07 DIAGNOSIS — R0681 Apnea, not elsewhere classified: Secondary | ICD-10-CM

## 2021-02-07 DIAGNOSIS — R0683 Snoring: Secondary | ICD-10-CM | POA: Insufficient documentation

## 2021-02-14 NOTE — Procedures (Signed)
  [place] A. Merlene Laughter, MD     [email or web address]             HOME SLEEP STUDY  LOCATION: [place]  Patient Name: Edward Barnes, Edward Barnes Date: 02/07/2021 Gender: Male D.O.B: Mar 24, 1976 Age (years): 44 Referring Provider: Melida Quitter Height (inches): 68 Interpreting Physician: Phillips Odor MD, ABSM Weight (lbs): 198 RPSGT: Rosebud Poles BMI: 30 MRN: [id number] Neck Size: CLINICAL INFORMATION Sleep Study Type: HST     Indication for sleep study: N/A     Epworth Sleepiness Score: N/A  SLEEP STUDY TECHNIQUE A multi-channel overnight portable sleep study was performed. The channels recorded were: nasal airflow, thoracic respiratory movement, and oxygen saturation with a pulse oximetry. Snoring was also monitored.  MEDICATIONS Patient self administered medications include: N/A. No current outpatient medications on file.   SLEEP ARCHITECTURE Patient was studied for 334 minutes. The sleep efficiency was 55.7 % and the patient was supine for 30.1%. The arousal index was 0.0 per hour.  RESPIRATORY PARAMETERS The overall AHI was 33.6 per hour, with a central apnea index of 0 per hour.  The oxygen nadir was 81% during sleep.     CARDIAC DATA Mean heart rate during sleep was 54.0 bpm.  IMPRESSIONS Severe obstructive sleep apnea occurred during this study (AHI = 33.6/h).  Auto PAP 8-14 is recommended.   Delano Metz, MD Diplomate, American Board of Sleep Medicine.  ELECTRONICALLY SIGNED ON:  02/14/2021, 10:37 AM [facility] PH: [phone]   FX: [phone] [place]
